# Patient Record
Sex: Female | Born: 1995 | Race: White | Hispanic: Yes | Marital: Single | Smoking: Former smoker
Health system: Southern US, Community
[De-identification: ages and names within clinical notes are randomized; demographics above are authoritative.]

## PROBLEM LIST (undated history)

## (undated) ENCOUNTER — Emergency Department: Discharge status: Absent without leave | Payer: No Typology Code available for payment source

## (undated) DIAGNOSIS — N938 Other specified abnormal uterine and vaginal bleeding: Secondary | ICD-10-CM

## (undated) DIAGNOSIS — R102 Pelvic and perineal pain unspecified side: Secondary | ICD-10-CM

## (undated) HISTORY — DX: Pelvic and perineal pain: R10.2

## (undated) HISTORY — PX: WISDOM TOOTH EXTRACTION: SHX21

## (undated) HISTORY — DX: Pelvic and perineal pain unspecified side: R10.20

## (undated) HISTORY — DX: Other specified abnormal uterine and vaginal bleeding: N93.8

## (undated) HISTORY — PX: NO PAST SURGERIES: SHX2092

---
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
## 2017-04-15 ENCOUNTER — Emergency Department
Admission: EM | Admit: 2017-04-15 | Discharge: 2017-04-15 | Disposition: A | Discharge status: Home / Self Care | Payer: Self-pay | Attending: Emergency Medicine | Admitting: Emergency Medicine

## 2017-04-15 ENCOUNTER — Encounter: Payer: Self-pay | Admitting: Emergency Medicine

## 2017-04-15 DIAGNOSIS — M791 Myalgia, unspecified site: Secondary | ICD-10-CM

## 2017-04-15 DIAGNOSIS — R5381 Other malaise: Secondary | ICD-10-CM | POA: Insufficient documentation

## 2017-04-15 DIAGNOSIS — N3000 Acute cystitis without hematuria: Secondary | ICD-10-CM | POA: Insufficient documentation

## 2017-04-15 LAB — COMPREHENSIVE METABOLIC PANEL
ALK PHOS: 81 U/L (ref 38–126)
ALT: 40 U/L (ref 14–54)
AST: 33 U/L (ref 15–41)
Albumin: 4.1 g/dL (ref 3.5–5.0)
Anion gap: 12 (ref 5–15)
BUN: 7 mg/dL (ref 6–20)
CHLORIDE: 108 mmol/L (ref 101–111)
CO2: 19 mmol/L — ABNORMAL LOW (ref 22–32)
Calcium: 9.1 mg/dL (ref 8.9–10.3)
Creatinine, Ser: 0.39 mg/dL — ABNORMAL LOW (ref 0.44–1.00)
GFR calc Af Amer: >60 mL/min (ref >60)
Glucose, Bld: 98 mg/dL (ref 65–99)
Potassium: 3.5 mmol/L (ref 3.5–5.1)
Sodium: 139 mmol/L (ref 135–145)
Total Bilirubin: 0.4 mg/dL (ref 0.3–1.2)
Total Protein: 8 g/dL (ref 6.5–8.1)

## 2017-04-15 LAB — URINALYSIS, COMPLETE (UACMP) WITH MICROSCOPIC
Bilirubin Urine: NEGATIVE
Glucose, UA: NEGATIVE mg/dL
Ketones, ur: NEGATIVE mg/dL
Nitrite: NEGATIVE
Protein, ur: 30 mg/dL — AB
SPECIFIC GRAVITY, URINE: 1.023 (ref 1.005–1.030)
pH: 6 (ref 5.0–8.0)

## 2017-04-15 LAB — CBC
HCT: 36.9 % (ref 35.0–47.0)
Hemoglobin: 12.6 g/dL (ref 12.0–16.0)
MCH: 29 pg (ref 26.0–34.0)
MCHC: 34.1 g/dL (ref 32.0–36.0)
MCV: 85 fL (ref 80.0–100.0)
Platelets: 297 10*3/uL (ref 150–440)
RBC: 4.35 MIL/uL (ref 3.80–5.20)
RDW: 12.8 % (ref 11.5–14.5)
WBC: 11 10*3/uL (ref 3.6–11.0)

## 2017-04-15 LAB — LIPASE, BLOOD: LIPASE: 15 U/L (ref 11–51)

## 2017-04-15 LAB — POCT PREGNANCY, URINE: PREG TEST UR: NEGATIVE

## 2017-04-15 MED ORDER — CEPHALEXIN 500 MG PO CAPS: 500.0000 mg | ORAL_CAPSULE | Freq: Four times a day (QID) | ORAL | 0 refills | Status: AC

## 2017-04-15 MED ORDER — SODIUM CHLORIDE 0.9 % IV BOLUS (SEPSIS)
1000.0000 mL | Freq: Once | INTRAVENOUS | Status: AC
  Administered 2017-04-15: 1000 mL via INTRAVENOUS

## 2017-04-15 MED ORDER — ONDANSETRON 4 MG PO TBDP
4.0000 mg | ORAL_TABLET | Freq: Once | ORAL | Status: AC | PRN
  Administered 2017-04-15: 4 mg via ORAL
  Filled 2017-04-15: qty 1

## 2017-04-15 MED ORDER — OXYCODONE-ACETAMINOPHEN 5-325 MG PO TABS
ORAL_TABLET | ORAL | Status: AC
  Filled 2017-04-15: qty 1

## 2017-04-15 MED ORDER — PHENAZOPYRIDINE HCL 100 MG PO TABS: 100.0000 mg | ORAL_TABLET | Freq: Three times a day (TID) | ORAL | 0 refills | Status: DC | PRN

## 2017-04-15 MED ORDER — OXYCODONE-ACETAMINOPHEN 5-325 MG PO TABS
1.0000 | ORAL_TABLET | ORAL | Status: DC | PRN
  Administered 2017-04-15: 1 via ORAL

## 2017-04-15 MED ORDER — DEXTROSE 5 % IV SOLN
1.0000 g | Freq: Once | INTRAVENOUS | Status: AC
  Administered 2017-04-15: 1 g via INTRAVENOUS
  Filled 2017-04-15: qty 10

## 2017-04-15 MED ORDER — KETOROLAC TROMETHAMINE 30 MG/ML IJ SOLN
30.0000 mg | Freq: Once | INTRAMUSCULAR | Status: AC
  Administered 2017-04-15: 30 mg via INTRAVENOUS
  Filled 2017-04-15: qty 1

## 2017-04-15 NOTE — ED Triage Notes (Signed)
Patient to ER for c/o lower abdominal pain and body aches for approx 1 week. States pain has not improved, now having N/V/D. Denies known fevers. Patient ambulatory to triage without difficulty. Patient states pain and vomiting seem to be worse after eating.

## 2017-04-15 NOTE — ED Provider Notes (Signed)
[facility] Emergency Department Provider Note   ____________________________________________   First MD Initiated Contact with Patient 04/15/17 [phone]     (approximate)  I have reviewed the triage vital signs and the nursing notes.   HISTORY  Chief Complaint Abdominal Pain    HPI Dawn Richards is a 21 y.o. female Who comes into the hospital today with a lot of lower abdominal pain. She reports that she's been having this pain for the past week. She went to sleep and then woke up and states the pain was worse. She had pain all over her body and she reports again in her lower abdomen. She is unsure what is causing the pain but it's in her mid lower abdomen. The patient also states that she's been having pain when she urinates. She has not been taking anything for pain at home. She states that she is also been vomiting when she eats and unable to keep anything down. The patient denies any fevers or pain with urination. She is denies blood in her urine but has had some back pain. The patient rates her pain a 10 out of 10 in intensity. She is here today for evaluation.   Past Medical History:  Diagnosis Date  . DUB (dysfunctional uterine bleeding)   . Pelvic pain     Patient Active Problem List   Diagnosis Date Noted  . 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 Dysmenorrhea 08/13/2016  . Abnormal uterine bleeding (AUB) 08/13/2016    Past Surgical History:  Procedure Laterality Date  . NO PAST SURGERIES      Prior to Admission medications   Medication Sig Start Date End Date Taking? Authorizing Provider  cephALEXin (KEFLEX) 500 MG capsule Take 1 capsule (500 mg total) by mouth 4 (four) times daily. 04/15/17 04/25/17  Rebecka Apley, MD  Norethindrone-Ethinyl Estradiol-Fe Biphas (LO LOESTRIN FE) 1 MG-10 MCG / 10 MCG tablet Take 1 tablet by mouth daily. 08/13/16   Defrancesco, Prentice Docker, MD  phenazopyridine (PYRIDIUM) 100 MG tablet Take 1 tablet (100 mg  total) by mouth 3 (three) times daily as needed for pain. 04/15/17   Rebecka Apley, MD  traMADol (ULTRAM) 50 MG tablet Take 1 tablet (50 mg total) by mouth every 6 (six) hours as needed. 08/10/16   Rebecka Apley, MD    Allergies Patient has no known allergies.  Family History  Problem Relation Age of Onset  . Breast cancer Neg Hx   . Ovarian cancer Neg Hx   . Colon cancer Neg Hx   . Diabetes Neg Hx   . Heart disease Neg Hx     Social History Social History  Substance Use Topics  . Smoking status: Never Smoker  . Smokeless tobacco: Never Used  . Alcohol use 0.0 oz/week     Comment: occas    Review of Systems  Constitutional: No fever/chills Eyes: No visual changes. ENT: No sore throat. Cardiovascular: Denies chest pain. Respiratory: Denies shortness of breath. Gastrointestinal:  abdominal pain.   nausea,  vomiting.  No diarrhea.  No constipation. Genitourinary:  dysuria. Musculoskeletal: myalgias and  back pain. Skin: Negative for rash. Neurological: Negative for headaches, focal weakness or numbness.   ____________________________________________   PHYSICAL EXAM:  VITAL SIGNS: ED Triage Vitals  Enc Vitals Group     BP 04/15/17 0401 (!) 116/101     Pulse Rate 04/15/17 0401 98     Resp 04/15/17 0401 20     Temp 04/15/17 0401 97.7  F (36.5 C)     Temp Source 04/15/17 0401 Oral     SpO2 04/15/17 0401 96 %     Weight --      Height 04/15/17 0401 5\' 1"  (1.549 m)     Head Circumference --      Peak Flow --      Pain Score 04/15/17 0400 10     Pain Loc --      Pain Edu? --      Excl. in GC? --     Constitutional: Alert and oriented. Well appearing and in no acute distress. Eyes: Conjunctivae are normal. PERRL. EOMI. Head: Atraumatic. Nose: No congestion/rhinnorhea. Mouth/Throat: Mucous membranes are moist.  Oropharynx non-erythematous. Cardiovascular: Normal rate, regular rhythm. Grossly normal heart sounds.  Good peripheral  circulation. Respiratory: Normal respiratory effort.  No retractions. Lungs CTAB. Gastrointestinal: Soft with suprapubic tenderness to palpation. No distention. Positive bowel sounds Musculoskeletal: No lower extremity tenderness nor edema.   Neurologic:  Normal speech and language. No gross focal neurologic deficits are appreciated. No gait instability. Skin:  Skin is warm, dry and intact. No rash noted. Psychiatric: Mood and affect are normal. Speech and behavior are normal.  ____________________________________________   LABS (all labs ordered are listed, but only abnormal results are displayed)  Labs Reviewed  COMPREHENSIVE METABOLIC PANEL - Abnormal; Notable for the following:       Result Value   CO2 19 (*)    Creatinine, Ser 0.39 (*)    All other components within normal limits  URINALYSIS, COMPLETE (UACMP) WITH MICROSCOPIC - Abnormal; Notable for the following:    Color, Urine YELLOW (*)    APPearance HAZY (*)    Hgb urine dipstick MODERATE (*)    Protein, ur 30 (*)    Leukocytes, UA SMALL (*)    Bacteria, UA RARE (*)    Squamous Epithelial / LPF 0-5 (*)    All other components within normal limits  URINE CULTURE  LIPASE, BLOOD  CBC  POC URINE PREG, ED   ____________________________________________  EKG  none ____________________________________________  RADIOLOGY  No results found.  ____________________________________________   PROCEDURES  Procedure(s) performed: None  Procedures  Critical Care performed: No  ____________________________________________   INITIAL IMPRESSION / ASSESSMENT AND PLAN / ED COURSE  Pertinent labs & imaging results that were available during my care of the patient were reviewed by me and considered in my medical decision making (see chart for details).  this is a 21 year old female who comes into the hospital today with some lower abdominal pain. The patient had some blood work drawn and it appears that she has some  urinary tract infection. the patient's white blood cell count is normal. She did receive a dose of Percocet as well as Zofran in triage. I will give the patient a dose of ceftriaxone and Toradol. His lungs the patient is able to drink and keep down fluids I will discharge her with some antibiotics for urinary tract infection. The patient did receive a liter of normal saline as well.the patient will be discharged home to follow-up with the acute care clinic.      ____________________________________________   FINAL CLINICAL IMPRESSION(S) / ED DIAGNOSES  Final diagnoses:  Malaise  Acute cystitis without hematuria  Myalgia      NEW MEDICATIONS STARTED DURING THIS VISIT:  New Prescriptions   CEPHALEXIN (KEFLEX) 500 MG CAPSULE    Take 1 capsule (500 mg total) by mouth 4 (four) times daily.   PHENAZOPYRIDINE (PYRIDIUM) 100  MG TABLET    Take 1 tablet (100 mg total) by mouth 3 (three) times daily as needed for pain.     Note:  This document was prepared using Dragon voice recognition software and may include unintentional dictation errors.    Rebecka ApleyWebster, Allison P, MD 04/15/17 [phone]

## 2017-04-15 NOTE — ED Notes (Signed)
Pt tolerating gingerale without difficulty 

## 2017-04-17 LAB — URINE CULTURE

## 2017-05-07 ENCOUNTER — Emergency Department
Admission: EM | Admit: 2017-05-07 | Discharge: 2017-05-08 | Disposition: A | Discharge status: Home / Self Care | Payer: Self-pay | Attending: Emergency Medicine | Admitting: Emergency Medicine

## 2017-05-07 ENCOUNTER — Encounter: Payer: Self-pay | Admitting: Emergency Medicine

## 2017-05-07 DIAGNOSIS — E872 Acidosis, unspecified: Secondary | ICD-10-CM

## 2017-05-07 DIAGNOSIS — Y929 Unspecified place or not applicable: Secondary | ICD-10-CM | POA: Insufficient documentation

## 2017-05-07 DIAGNOSIS — F4325 Adjustment disorder with mixed disturbance of emotions and conduct: Secondary | ICD-10-CM

## 2017-05-07 DIAGNOSIS — Z79899 Other long term (current) drug therapy: Secondary | ICD-10-CM | POA: Insufficient documentation

## 2017-05-07 DIAGNOSIS — Y998 Other external cause status: Secondary | ICD-10-CM | POA: Insufficient documentation

## 2017-05-07 DIAGNOSIS — T39312A Poisoning by propionic acid derivatives, intentional self-harm, initial encounter: Secondary | ICD-10-CM | POA: Insufficient documentation

## 2017-05-07 DIAGNOSIS — T1491XA Suicide attempt, initial encounter: Secondary | ICD-10-CM | POA: Insufficient documentation

## 2017-05-07 DIAGNOSIS — Y9389 Activity, other specified: Secondary | ICD-10-CM | POA: Insufficient documentation

## 2017-05-07 DIAGNOSIS — X838XXA Intentional self-harm by other specified means, initial encounter: Secondary | ICD-10-CM | POA: Insufficient documentation

## 2017-05-07 DIAGNOSIS — T39392S Poisoning by other nonsteroidal anti-inflammatory drugs [NSAID], intentional self-harm, sequela: Secondary | ICD-10-CM

## 2017-05-07 LAB — COMPREHENSIVE METABOLIC PANEL
ALT: 35 U/L (ref 14–54)
ANION GAP: 9 (ref 5–15)
AST: 25 U/L (ref 15–41)
Albumin: 4.2 g/dL (ref 3.5–5.0)
Alkaline Phosphatase: 74 U/L (ref 38–126)
BUN: 12 mg/dL (ref 6–20)
CALCIUM: 8.8 mg/dL — AB (ref 8.9–10.3)
CO2: 16 mmol/L — AB (ref 22–32)
Chloride: 112 mmol/L — ABNORMAL HIGH (ref 101–111)
Creatinine, Ser: 1 mg/dL (ref 0.44–1.00)
GFR calc non Af Amer: >60 mL/min (ref >60)
Glucose, Bld: 99 mg/dL (ref 65–99)
Potassium: 3.9 mmol/L (ref 3.5–5.1)
SODIUM: 137 mmol/L (ref 135–145)
Total Bilirubin: 0.5 mg/dL (ref 0.3–1.2)
Total Protein: 8.2 g/dL — ABNORMAL HIGH (ref 6.5–8.1)

## 2017-05-07 LAB — CBC
HCT: 40.2 % (ref 35.0–47.0)
HEMOGLOBIN: 13.6 g/dL (ref 12.0–16.0)
MCH: 29 pg (ref 26.0–34.0)
MCHC: 33.9 g/dL (ref 32.0–36.0)
MCV: 85.4 fL (ref 80.0–100.0)
PLATELETS: 283 10*3/uL (ref 150–440)
RBC: 4.7 MIL/uL (ref 3.80–5.20)
RDW: 13.2 % (ref 11.5–14.5)
WBC: 17 10*3/uL — ABNORMAL HIGH (ref 3.6–11.0)

## 2017-05-07 LAB — ACETAMINOPHEN LEVEL

## 2017-05-07 LAB — SALICYLATE LEVEL

## 2017-05-07 LAB — POCT PREGNANCY, URINE: PREG TEST UR: NEGATIVE

## 2017-05-07 LAB — ETHANOL: Alcohol, Ethyl (B): <10 mg/dL (ref <10)

## 2017-05-07 MED ORDER — SODIUM CHLORIDE 0.9 % IV BOLUS (SEPSIS)
1000.0000 mL | Freq: Once | INTRAVENOUS | Status: AC
  Administered 2017-05-08: 1000 mL via INTRAVENOUS

## 2017-05-07 NOTE — ED Notes (Addendum)
White tank, white bra, black skirt, flipflops, removed and placed into labeled pt belonging bag to be secured on nursing unit; gold tone necklace and earrings with $5 bill given to pt's sister at her request Dois Davenport)
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
## 2017-05-07 NOTE — ED Provider Notes (Signed)
[facility] Emergency Department Provider Note  ____________________________________________  Time seen: Approximately 11:56 PM  I have reviewed the triage vital signs and the nursing notes.   HISTORY  Chief Complaint No chief complaint on file.   HPI Dawn Richards is a 21 y.o. female with no significant past medical history who presents for evaluation after a intentional overdose. Patient reports a 24 hours ago she took 20 x 800 mg ibuprofen in a suicide attempt. Today she told a friend who brought her to the emergency room. She is complaining of nausea that has been constant for the last several hours but no vomiting, no chest pain, no abdominal pain, no hematemesis or melena. Patient denies prior history of depression but does state that she's been going through a lot recently. she has been feeling very depressed. She denies drugs and alcohol. She denies any other medical problems.   Past Medical History:  Diagnosis Date  . DUB (dysfunctional uterine bleeding)   . Pelvic pain     Patient Active Problem List   Diagnosis Date Noted  . 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 Dysmenorrhea 08/13/2016  . Abnormal uterine bleeding (AUB) 08/13/2016    Past Surgical History:  Procedure Laterality Date  . NO PAST SURGERIES      Prior to Admission medications   Medication Sig Start Date End Date Taking? Authorizing Provider  Norethindrone-Ethinyl Estradiol-Fe Biphas (LO LOESTRIN FE) 1 MG-10 MCG / 10 MCG tablet Take 1 tablet by mouth daily. 08/13/16   Defrancesco, Prentice Docker, MD    Allergies Patient has no known allergies.  Family History  Problem Relation Age of Onset  . Breast cancer Neg Hx   . Ovarian cancer Neg Hx   . Colon cancer Neg Hx   . Diabetes Neg Hx   . Heart disease Neg Hx     Social History Social History  Substance Use Topics  . Smoking status: Never Smoker  . Smokeless tobacco: Never Used  . Alcohol use 0.0 oz/week     Comment:  occas    Review of Systems  Constitutional: Negative for fever. Eyes: Negative for visual changes. ENT: Negative for sore throat. Neck: No neck pain  Cardiovascular: Negative for chest pain. Respiratory: Negative for shortness of breath. Gastrointestinal: Negative for abdominal pain, vomiting or diarrhea. + nausea Genitourinary: Negative for dysuria. Musculoskeletal: Negative for back pain. Skin: Negative for rash. Neurological: Negative for headaches, weakness or numbness. Psych: + SI. No HI  ____________________________________________   PHYSICAL EXAM:  VITAL SIGNS: ED Triage Vitals  Enc Vitals Group     BP 05/07/17 2255 124/79     Pulse --      Resp 05/07/17 2255 18     Temp 05/07/17 2255 97.9 F (36.6 C)     Temp Source 05/07/17 2255 Oral     SpO2 05/07/17 2255 98 %     Weight 05/07/17 2252 200 lb (90.7 kg)     Height 05/07/17 2252 5' (1.524 m)     Head Circumference --      Peak Flow --      Pain Score --      Pain Loc --      Pain Edu? --      Excl. in GC? --     Constitutional: Alert and oriented. Well appearing and in no apparent distress. HEENT:      Head: Normocephalic and atraumatic.         Eyes: Conjunctivae are normal. Sclera  is non-icteric.       Mouth/Throat: Mucous membranes are moist.       Neck: Supple with no signs of meningismus. Cardiovascular: Regular rate and rhythm. No murmurs, gallops, or rubs. 2+ symmetrical distal pulses are present in all extremities. No JVD. Respiratory: Normal respiratory effort. Lungs are clear to auscultation bilaterally. No wheezes, crackles, or rhonchi.  Gastrointestinal: Soft, non tender, and non distended with positive bowel sounds. No rebound or guarding. Musculoskeletal: Nontender with normal range of motion in all extremities. No edema, cyanosis, or erythema of extremities. Neurologic: Normal speech and language. Face is symmetric. Moving all extremities. No gross focal neurologic deficits are  appreciated. Skin: Skin is warm, dry and intact. No rash noted. Psychiatric: Mood and affect are normal. Speech and behavior are normal.  ____________________________________________   LABS (all labs ordered are listed, but only abnormal results are displayed)  Labs Reviewed  COMPREHENSIVE METABOLIC PANEL - Abnormal; Notable for the following:       Result Value   Chloride 112 (*)    CO2 16 (*)    Calcium 8.8 (*)    Total Protein 8.2 (*)    All other components within normal limits  ACETAMINOPHEN LEVEL - Abnormal; Notable for the following:    Acetaminophen (Tylenol), Serum <10 (*)    All other components within normal limits  CBC - Abnormal; Notable for the following:    WBC 17.0 (*)    All other components within normal limits  URINE DRUG SCREEN, QUALITATIVE ([facility] ONLY) - Abnormal; Notable for the following:    Cannabinoid 50 Ng, Ur [place] POSITIVE (*)    All other components within normal limits  URINALYSIS, COMPLETE (UACMP) WITH MICROSCOPIC - Abnormal; Notable for the following:    Color, Urine YELLOW (*)    APPearance CLOUDY (*)    Glucose, UA 50 (*)    Hgb urine dipstick MODERATE (*)    Protein, ur 100 (*)    Leukocytes, UA LARGE (*)    Bacteria, UA RARE (*)    Squamous Epithelial / LPF 0-5 (*)    Non Squamous Epithelial 0-5 (*)    All other components within normal limits  BASIC METABOLIC PANEL - Abnormal; Notable for the following:    Chloride 115 (*)    CO2 17 (*)    Glucose, Bld 135 (*)    Calcium 8.2 (*)    All other components within normal limits  ETHANOL  SALICYLATE LEVEL  POCT PREGNANCY, URINE   ____________________________________________  EKG  ED ECG REPORT I, Nita Sickle, the attending physician, personally viewed and interpreted this ECG.  Normal sinus rhythm, rate of 76, normal intervals, normal axis, no ST elevations or depressions.  ____________________________________________  RADIOLOGY  none   ____________________________________________   PROCEDURES  Procedure(s) performed: None Procedures Critical Care performed:  None ____________________________________________   INITIAL IMPRESSION / ASSESSMENT AND PLAN / ED COURSE  21 y.o. female with no significant past medical history who presents for evaluation after a intentional overdose after taking 20 x 800 mg of ibuprofen 24 hour PTA. patient is hemodynamically stable with nausea. Discussed with poison control who recommended IVF due to nausea and low CO2. Electrolytes, salicylate and acetaminophen levels are all within normal limits otherwise. Patient will be given Zofran for nausea. EKG is pending.IVC papers will be taken and psychiatry has been consulted.  Clinical Course as of May 09 743  Fri May 08, 2017  1610 Repeat labs after IVF showing persistent low CO2 with no  AG. Will give a dose of bicarbonate and 2nd bolus and repeat BMP. Patient will be medically cleared once bicarbonate level is normalized.  [CV]  0451 Psychiatry recommended inpatient admission after medically cleared  [CV]  0720 Signout from Dr. Don Perking: Pt is s/p ibuprofen overdose.  She has persistent low bicarb after fluids; will be given repletion gtt x 3h and then will recheck.  If bicarb normal and pt medically cleared, will be admitted to psych.   [SS]  0742 Bicarbonate is slightly improved currently at 20. We'll start patient on a bicarbonate drip for 3 hours and repeat BMP. Care transferred to Dr. Marisa Severin  [CV]    Clinical Course User Index [CV] Nita Sickle, MD [SS] Dionne Bucy, MD    Pertinent labs & imaging results that were available during my care of the patient were reviewed by me and considered in my medical decision making (see chart for details).    ____________________________________________   FINAL CLINICAL IMPRESSION(S) / ED DIAGNOSES  Final diagnoses:  Intentional ibuprofen overdose, initial encounter (HCC)   Suicide attempt (HCC)  Metabolic acidosis      NEW MEDICATIONS STARTED DURING THIS VISIT:  New Prescriptions   No medications on file     Note:  This document was prepared using Dragon voice recognition software and may include unintentional dictation errors.    Don Perking, [place], MD 05/08/17 [phone]

## 2017-05-08 ENCOUNTER — Other Ambulatory Visit: Payer: Self-pay

## 2017-05-08 DIAGNOSIS — T39392S Poisoning by other nonsteroidal anti-inflammatory drugs [NSAID], intentional self-harm, sequela: Secondary | ICD-10-CM

## 2017-05-08 DIAGNOSIS — F4325 Adjustment disorder with mixed disturbance of emotions and conduct: Secondary | ICD-10-CM

## 2017-05-08 LAB — URINE DRUG SCREEN, QUALITATIVE (ARMC ONLY)
Amphetamines, Ur Screen: NOT DETECTED
BARBITURATES, UR SCREEN: NOT DETECTED
Benzodiazepine, Ur Scrn: NOT DETECTED
CANNABINOID 50 NG, UR ~~LOC~~: POSITIVE — AB
COCAINE METABOLITE, UR ~~LOC~~: NOT DETECTED
MDMA (Ecstasy)Ur Screen: NOT DETECTED
Methadone Scn, Ur: NOT DETECTED
OPIATE, UR SCREEN: NOT DETECTED
PHENCYCLIDINE (PCP) UR S: NOT DETECTED
Tricyclic, Ur Screen: NOT DETECTED

## 2017-05-08 LAB — URINALYSIS, COMPLETE (UACMP) WITH MICROSCOPIC
Bilirubin Urine: NEGATIVE
GLUCOSE, UA: 50 mg/dL — AB
KETONES UR: NEGATIVE mg/dL
Nitrite: NEGATIVE
PROTEIN: 100 mg/dL — AB
Specific Gravity, Urine: 1.016 (ref 1.005–1.030)
pH: 5 (ref 5.0–8.0)

## 2017-05-08 LAB — BASIC METABOLIC PANEL
ANION GAP: 8 (ref 5–15)
Anion gap: 5 (ref 5–15)
Anion gap: 7 (ref 5–15)
Anion gap: 9 (ref 5–15)
BUN: 12 mg/dL (ref 6–20)
BUN: 9 mg/dL (ref 6–20)
BUN: 8 mg/dL (ref 6–20)
BUN: 6 mg/dL (ref 6–20)
CALCIUM: 8.2 mg/dL — AB (ref 8.9–10.3)
CALCIUM: 7.9 mg/dL — AB (ref 8.9–10.3)
CO2: 17 mmol/L — ABNORMAL LOW (ref 22–32)
CO2: 20 mmol/L — ABNORMAL LOW (ref 22–32)
CO2: 20 mmol/L — ABNORMAL LOW (ref 22–32)
CO2: 20 mmol/L — ABNORMAL LOW (ref 22–32)
CREATININE: 0.78 mg/dL (ref 0.44–1.00)
CREATININE: 0.65 mg/dL (ref 0.44–1.00)
Calcium: 8.1 mg/dL — ABNORMAL LOW (ref 8.9–10.3)
Calcium: 8.2 mg/dL — ABNORMAL LOW (ref 8.9–10.3)
Chloride: 115 mmol/L — ABNORMAL HIGH (ref 101–111)
Chloride: 115 mmol/L — ABNORMAL HIGH (ref 101–111)
Chloride: 112 mmol/L — ABNORMAL HIGH (ref 101–111)
Chloride: 111 mmol/L (ref 101–111)
Creatinine, Ser: 0.55 mg/dL (ref 0.44–1.00)
Creatinine, Ser: 0.61 mg/dL (ref 0.44–1.00)
GFR calc Af Amer: >60 mL/min (ref >60)
GFR calc Af Amer: >60 mL/min (ref >60)
GFR calc Af Amer: >60 mL/min (ref >60)
GFR calc Af Amer: >60 mL/min (ref >60)
GLUCOSE: 135 mg/dL — AB (ref 65–99)
GLUCOSE: 133 mg/dL — AB (ref 65–99)
GLUCOSE: 114 mg/dL — AB (ref 65–99)
GLUCOSE: 170 mg/dL — AB (ref 65–99)
POTASSIUM: 3.8 mmol/L (ref 3.5–5.1)
POTASSIUM: 3.3 mmol/L — AB (ref 3.5–5.1)
Potassium: 3.8 mmol/L (ref 3.5–5.1)
Potassium: 3.6 mmol/L (ref 3.5–5.1)
Sodium: 140 mmol/L (ref 135–145)
Sodium: 140 mmol/L (ref 135–145)
Sodium: 139 mmol/L (ref 135–145)
Sodium: 140 mmol/L (ref 135–145)

## 2017-05-08 LAB — BLOOD GAS, VENOUS
ACID-BASE DEFICIT: 3.6 mmol/L — AB (ref 0.0–2.0)
BICARBONATE: 20.4 mmol/L (ref 20.0–28.0)
O2 SAT: 96.1 %
Patient temperature: 37
pCO2, Ven: 33 mmHg — ABNORMAL LOW (ref 44.0–60.0)
pH, Ven: 7.4 (ref 7.250–7.430)
pO2, Ven: 83 mmHg — ABNORMAL HIGH (ref 32.0–45.0)

## 2017-05-08 MED ORDER — SODIUM BICARBONATE 8.4 % IV SOLN
INTRAVENOUS | Status: AC
  Filled 2017-05-08: qty 50

## 2017-05-08 MED ORDER — DEXTROSE 5 % IV SOLN
INTRAVENOUS | Status: DC
  Administered 2017-05-08: 08:00:00 via INTRAVENOUS
  Filled 2017-05-08 (×3): qty 150

## 2017-05-08 MED ORDER — FOSFOMYCIN TROMETHAMINE 3 G PO PACK
3.0000 g | PACK | Freq: Once | ORAL | Status: AC
  Administered 2017-05-08: 3 g via ORAL
  Filled 2017-05-08: qty 3

## 2017-05-08 MED ORDER — SODIUM CHLORIDE 0.9 % IV BOLUS (SEPSIS)
1000.0000 mL | Freq: Once | INTRAVENOUS | Status: AC
  Administered 2017-05-08: 1000 mL via INTRAVENOUS

## 2017-05-08 MED ORDER — SODIUM BICARBONATE 8.4 % IV SOLN
50.0000 meq | Freq: Once | INTRAVENOUS | Status: AC
  Administered 2017-05-08: 50 meq via INTRAVENOUS
  Filled 2017-05-08: qty 50

## 2017-05-08 MED ORDER — ONDANSETRON HCL 4 MG/2ML IJ SOLN
4.0000 mg | Freq: Once | INTRAMUSCULAR | Status: AC
  Administered 2017-05-08: 4 mg via INTRAVENOUS
  Filled 2017-05-08: qty 2

## 2017-05-08 NOTE — ED Notes (Signed)
BMP sent down to lab.

## 2017-05-08 NOTE — ED Notes (Signed)
Labs redrawn

## 2017-05-08 NOTE — Discharge Instructions (Signed)
Return to the ER for new, worsening or persistent vomiting, weakness or any other symptoms that concern you, as well as if you have any new or recurrent feelings of wanting to hurt yourself.  Follow up at [facility] and with your primary doctor.

## 2017-05-08 NOTE — ED Notes (Signed)
Pt given meal tray.

## 2017-05-08 NOTE — ED Notes (Signed)
MD notified about lab results of repeat BMP. Will check another bmp at 1300 with [facility] per Dr. Marisa Severin.

## 2017-05-08 NOTE — ED Notes (Signed)
Called pharmacy for D5 w/ bicarb

## 2017-05-08 NOTE — Consult Note (Signed)
[facility] Consult   Reason for Consult:  Consult for 21 year old woman who came to the emergency room after having symptoms from an overdose of ibuprofen Referring Physician:  Cinda Quest Patient Identification: Dawn Richards MRN:  [id number] Principal Diagnosis: Adjustment disorder with mixed disturbance of emotions and conduct Diagnosis:   Patient Active Problem List   Diagnosis Date Noted  . Adjustment disorder with mixed disturbance of emotions and conduct [F43.25] 05/08/2017  . NSAID overdose, intentional self-harm, sequela ([place]) [T39.392S] 05/08/2017  . Female dyspareunia [N94.10] 08/13/2016  . Dysmenorrhea [N94.6] 08/13/2016  . Abnormal uterine bleeding (AUB) [N93.9] 08/13/2016    Total Time spent with patient: 1 hour  Subjective:   Dawn Richards is a 21 y.o. female patient admitted with "I took a bunch of pills".  HPI:  Patient seen and interviewed chart reviewed. 21 year old woman came to the emergency room last night complaining of abdominal pain and having some blood in her vomit. Patient reports that on Wednesday night she had taken about 20 ibuprofen tablets. She did this very spontaneously while she was feeling bad. The most acute stress was that her father had shown anger with her and disappointment when he found that she had used marijuana. Patient says at that time she was not thinking clearly and did not really want to die. Very soon after doing it she called a friend on the phone and told them about it. The friend unfortunately gave her less than solid advice and told her to just sleep it off. The next day she was having a stomachache the progress through the day and she finally came to the hospital. Patient reports she has had significant stress recently. She had been living with a friend but had to move out of their this last weekend and now is back living with her parents. She had gone to a party also this weekend and came home smelling like  marijuana which upset her parents. She denies however being consistently sad and down. Doesn't feel hopeless. Does not have any wish to die currently. Denies any psychotic symptoms. Sleeps and eats adequately. She says she drinks alcohol only rarely with friends and doesn't feel it's been a problem. As she only used marijuana 1 time this weekend. Not currently receiving outpatient mental health treatment.  Social history: Patient is living with her parents. 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  Medical history: She had some typical lab values for the overdose of ibuprofen and had some nausea with some gastric bleeding but doesn't have any ongoing medical issues.  Substance abuse history: As noted above she claims that she only uses marijuana one time. She has it in her drug screen but nothing else. Says she drinks only rarely.  Past Psychiatric History: Patient was seen by a therapist when she was about 21 years old for some cutting that she was doing at that time. He says the therapy was helpful for her. Had no other self injury between that time and now. No history of hospitalizations. Never been prescribed any psychiatric medicine other than having been put on ADHD medicine as a smaller child. She took it only briefly because reportedly her mother did not like the effect. No history of violence. No psychosis.  Risk to Self: Suicidal Ideation: Yes-Currently Present Suicidal Intent: Yes-Currently Present Is patient at risk for suicide?: Yes Suicidal Plan?: Yes-Currently Present Specify Current Suicidal  Plan: Pt took an overdose of pills Access to Means: Yes Specify Access to Suicidal Means: pt has access to pills What has been your use of drugs/alcohol within the last 12 months?: alcohol How many times?: 1 Other Self Harm Risks: none identified Triggers for Past Attempts:  Anniversary Intentional Self Injurious Behavior: Cutting Comment - Self Injurious Behavior: Pt reports hx of cutting Risk to Others: Homicidal Ideation: No Thoughts of Harm to Others: No Current Homicidal Intent: No Current Homicidal Plan: No Access to Homicidal Means: No Identified Victim: none identified History of harm to others?: No Assessment of Violence: None Noted Violent Behavior Description: none identified Does patient have access to weapons?: No Criminal Charges Pending?: No Does patient have a court date: No Prior Inpatient Therapy: Prior Inpatient Therapy: No Prior Therapy Dates: na Prior Therapy Facilty/Provider(s): na Reason for Treatment: na Prior Outpatient Therapy: Prior Outpatient Therapy: No Prior Therapy Dates: na Prior Therapy Facilty/Provider(s): na Reason for Treatment: na Does patient have an ACCT team?: No Does patient have Intensive In-House Services?  : No Does patient have [facility] services? : No Does patient have P4[facility] services?: No  Past Medical History:  Past Medical History:  Diagnosis Date  . DUB (dysfunctional uterine bleeding)   . Pelvic pain     Past Surgical History:  Procedure Laterality Date  . NO PAST SURGERIES     Family History:  Family History  Problem Relation Age of Onset  . Breast cancer Neg Hx   . Ovarian cancer Neg Hx   . Colon cancer Neg Hx   . Diabetes Neg Hx   . Heart disease Neg Hx    Family Psychiatric  History: Her mother has had some significant depression and mental health problems nobody else in the family known. Social History:  History  Alcohol Use  . 0.0 oz/week    Comment: occas     History  Drug Use No    Social History   Social History  . Marital status: Single    Spouse name: N/A  . Number of children: N/A  . Years of education: N/A   Social History Main Topics  . Smoking status: Never Smoker  . Smokeless tobacco: Never Used  . Alcohol use 0.0 oz/week     Comment: occas  . Drug use: No   . Sexual activity: Yes    Birth control/ protection: None   Other Topics Concern  . None   Social History Narrative  . None   Additional Social History:    Allergies:  No Known Allergies  Labs:  Results for orders placed or performed during the hospital encounter of 05/07/17 (from the past 48 hour(s))  Comprehensive metabolic panel     Status: Abnormal   Collection Time: 05/07/17 10:59 PM  Result Value Ref Range   Sodium 137 135 - 145 mmol/L   Potassium 3.9 3.5 - 5.1 mmol/L   Chloride 112 (H) 101 - 111 mmol/L   CO2 16 (L) 22 - 32 mmol/L   Glucose, Bld 99 65 - 99 mg/dL   BUN 12 6 - 20 mg/dL   Creatinine, Ser 1.00 0.44 - 1.00 mg/dL   Calcium 8.8 (L) 8.9 - 10.3 mg/dL   Total Protein 8.2 (H) 6.5 - 8.1 g/dL   Albumin 4.2 3.5 - 5.0 g/dL   AST 25 15 - 41 U/L   ALT 35 14 - 54 U/L   Alkaline Phosphatase 74 38 - 126 U/L   Total Bilirubin 0.5 0.3 -  1.2 mg/dL   GFR calc non Af Amer >60 >60 mL/min   GFR calc Af Amer >60 >60 mL/min    Comment: (NOTE) The eGFR has been calculated using the CKD EPI equation. This calculation has not been validated in all clinical situations. eGFR's persistently <60 mL/min signify possible Chronic Kidney Disease.    Anion gap 9 5 - 15  Ethanol     Status: None   Collection Time: 05/07/17 10:59 PM  Result Value Ref Range   Alcohol, Ethyl (B) <10 <10 mg/dL    Comment:        LOWEST DETECTABLE LIMIT FOR SERUM ALCOHOL IS 10 mg/dL FOR MEDICAL PURPOSES ONLY Please note change in reference range.   Salicylate level     Status: None   Collection Time: 05/07/17 10:59 PM  Result Value Ref Range   Salicylate Lvl <1.7 2.8 - 30.0 mg/dL  Acetaminophen level     Status: Abnormal   Collection Time: 05/07/17 10:59 PM  Result Value Ref Range   Acetaminophen (Tylenol), Serum <10 (L) 10 - 30 ug/mL    Comment:        THERAPEUTIC CONCENTRATIONS VARY SIGNIFICANTLY. A RANGE OF 10-30 ug/mL MAY BE AN EFFECTIVE CONCENTRATION FOR MANY PATIENTS. HOWEVER, SOME  ARE BEST TREATED AT CONCENTRATIONS OUTSIDE THIS RANGE. ACETAMINOPHEN CONCENTRATIONS >150 ug/mL AT 4 HOURS AFTER INGESTION AND >50 ug/mL AT 12 HOURS AFTER INGESTION ARE OFTEN ASSOCIATED WITH TOXIC REACTIONS.   cbc     Status: Abnormal   Collection Time: 05/07/17 10:59 PM  Result Value Ref Range   WBC 17.0 (H) 3.6 - 11.0 K/uL   RBC 4.70 3.80 - 5.20 MIL/uL   Hemoglobin 13.6 12.0 - 16.0 g/dL   HCT 40.2 35.0 - 47.0 %   MCV 85.4 80.0 - 100.0 fL   MCH 29.0 26.0 - 34.0 pg   MCHC 33.9 32.0 - 36.0 g/dL   RDW 13.2 11.5 - 14.5 %   Platelets 283 150 - 440 K/uL  Pregnancy, urine POC     Status: None   Collection Time: 05/07/17 11:19 PM  Result Value Ref Range   Preg Test, Ur NEGATIVE NEGATIVE    Comment:        THE SENSITIVITY OF THIS METHODOLOGY IS >24 mIU/mL   Urine Drug Screen, Qualitative     Status: Abnormal   Collection Time: 05/08/17 12:18 AM  Result Value Ref Range   Tricyclic, Ur Screen NONE DETECTED NONE DETECTED   Amphetamines, Ur Screen NONE DETECTED NONE DETECTED   MDMA (Ecstasy)Ur Screen NONE DETECTED NONE DETECTED   Cocaine Metabolite,Ur [place] NONE DETECTED NONE DETECTED   Opiate, Ur Screen NONE DETECTED NONE DETECTED   Phencyclidine (PCP) Ur S NONE DETECTED NONE DETECTED   Cannabinoid 50 Ng, Ur [place] POSITIVE (A) NONE DETECTED   Barbiturates, Ur Screen NONE DETECTED NONE DETECTED   Benzodiazepine, Ur Scrn NONE DETECTED NONE DETECTED   Methadone Scn, Ur NONE DETECTED NONE DETECTED    Comment: (NOTE) 616  Tricyclics, urine               Cutoff 1000 ng/mL 200  Amphetamines, urine             Cutoff 1000 ng/mL 300  MDMA (Ecstasy), urine           Cutoff 500 ng/mL 400  Cocaine Metabolite, urine       Cutoff 300 ng/mL 500  Opiate, urine  Cutoff 300 ng/mL 600  Phencyclidine (PCP), urine      Cutoff 25 ng/mL 700  Cannabinoid, urine              Cutoff 50 ng/mL 800  Barbiturates, urine             Cutoff 200 ng/mL 900  Benzodiazepine, urine           Cutoff 200  ng/mL 1000 Methadone, urine                Cutoff 300 ng/mL 1100 1200 The urine drug screen provides only a preliminary, unconfirmed 1300 analytical test result and should not be used for non-medical 1400 purposes. Clinical consideration and professional judgment should 1500 be applied to any positive drug screen result due to possible 1600 interfering substances. A more specific alternate chemical method 1700 must be used in order to obtain a confirmed analytical result.  1800 Gas chromato graphy / mass spectrometry (GC/MS) is the preferred 1900 confirmatory method.   Urinalysis, Complete w Microscopic     Status: Abnormal   Collection Time: 05/08/17 12:18 AM  Result Value Ref Range   Color, Urine YELLOW (A) YELLOW   APPearance CLOUDY (A) CLEAR   Specific Gravity, Urine 1.016 1.005 - 1.030   pH 5.0 5.0 - 8.0   Glucose, UA 50 (A) NEGATIVE mg/dL   Hgb urine dipstick MODERATE (A) NEGATIVE   Bilirubin Urine NEGATIVE NEGATIVE   Ketones, ur NEGATIVE NEGATIVE mg/dL   Protein, ur 100 (A) NEGATIVE mg/dL   Nitrite NEGATIVE NEGATIVE   Leukocytes, UA LARGE (A) NEGATIVE   RBC / HPF 6-30 0 - 5 RBC/hpf   WBC, UA TOO NUMEROUS TO COUNT 0 - 5 WBC/hpf   Bacteria, UA RARE (A) NONE SEEN   Squamous Epithelial / LPF 0-5 (A) NONE SEEN   WBC Clumps PRESENT    Mucus PRESENT    Non Squamous Epithelial 0-5 (A) NONE SEEN  Basic metabolic panel     Status: Abnormal   Collection Time: 05/08/17  3:00 AM  Result Value Ref Range   Sodium 140 135 - 145 mmol/L   Potassium 3.8 3.5 - 5.1 mmol/L   Chloride 115 (H) 101 - 111 mmol/L   CO2 17 (L) 22 - 32 mmol/L   Glucose, Bld 135 (H) 65 - 99 mg/dL   BUN 12 6 - 20 mg/dL   Creatinine, Ser 0.78 0.44 - 1.00 mg/dL   Calcium 8.2 (L) 8.9 - 10.3 mg/dL   GFR calc non Af Amer >60 >60 mL/min   GFR calc Af Amer >60 >60 mL/min    Comment: (NOTE) The eGFR has been calculated using the CKD EPI equation. This calculation has not been validated in all clinical  situations. eGFR's persistently <60 mL/min signify possible Chronic Kidney Disease.    Anion gap 8 5 - 15  Basic metabolic panel     Status: Abnormal   Collection Time: 05/08/17  6:25 AM  Result Value Ref Range   Sodium 140 135 - 145 mmol/L   Potassium 3.6 3.5 - 5.1 mmol/L   Chloride 115 (H) 101 - 111 mmol/L   CO2 20 (L) 22 - 32 mmol/L   Glucose, Bld 133 (H) 65 - 99 mg/dL   BUN 9 6 - 20 mg/dL   Creatinine, Ser 0.65 0.44 - 1.00 mg/dL   Calcium 7.9 (L) 8.9 - 10.3 mg/dL   GFR calc non Af Amer >60 >60 mL/min   GFR calc Af Amer >60 >  60 mL/min    Comment: (NOTE) The eGFR has been calculated using the CKD EPI equation. This calculation has not been validated in all clinical situations. eGFR's persistently <60 mL/min signify possible Chronic Kidney Disease.    Anion gap 5 5 - 15  Basic metabolic panel     Status: Abnormal   Collection Time: 05/08/17 10:43 AM  Result Value Ref Range   Sodium 139 135 - 145 mmol/L   Potassium 3.8 3.5 - 5.1 mmol/L   Chloride 112 (H) 101 - 111 mmol/L   CO2 20 (L) 22 - 32 mmol/L   Glucose, Bld 114 (H) 65 - 99 mg/dL   BUN 8 6 - 20 mg/dL   Creatinine, Ser 0.55 0.44 - 1.00 mg/dL   Calcium 8.1 (L) 8.9 - 10.3 mg/dL   GFR calc non Af Amer >60 >60 mL/min   GFR calc Af Amer >60 >60 mL/min    Comment: (NOTE) The eGFR has been calculated using the CKD EPI equation. This calculation has not been validated in all clinical situations. eGFR's persistently <60 mL/min signify possible Chronic Kidney Disease.    Anion gap 7 5 - 15  Basic metabolic panel     Status: Abnormal   Collection Time: 05/08/17  1:13 PM  Result Value Ref Range   Sodium 140 135 - 145 mmol/L   Potassium 3.3 (L) 3.5 - 5.1 mmol/L   Chloride 111 101 - 111 mmol/L   CO2 20 (L) 22 - 32 mmol/L   Glucose, Bld 170 (H) 65 - 99 mg/dL   BUN 6 6 - 20 mg/dL   Creatinine, Ser 0.61 0.44 - 1.00 mg/dL   Calcium 8.2 (L) 8.9 - 10.3 mg/dL   GFR calc non Af Amer >60 >60 mL/min   GFR calc Af Amer >60 >60  mL/min    Comment: (NOTE) The eGFR has been calculated using the CKD EPI equation. This calculation has not been validated in all clinical situations. eGFR's persistently <60 mL/min signify possible Chronic Kidney Disease.    Anion gap 9 5 - 15  Blood gas, venous     Status: Abnormal   Collection Time: 05/08/17  1:16 PM  Result Value Ref Range   pH, Ven 7.40 7.250 - 7.430   pCO2, Ven 33 (L) 44.0 - 60.0 mmHg   pO2, Ven 83.0 (H) 32.0 - 45.0 mmHg   Bicarbonate 20.4 20.0 - 28.0 mmol/L   Acid-base deficit 3.6 (H) 0.0 - 2.0 mmol/L   O2 Saturation 96.1 %   Patient temperature 37.0    Collection site VEIN    Sample type VEIN     Current Facility-Administered Medications  Medication Dose Route Frequency Provider Last Rate Last Dose  . sodium bicarbonate 150 mEq in dextrose 5 % 1,000 mL infusion   Intravenous Continuous Rudene Re, MD 125 mL/hr at 05/08/17 3810     Current Outpatient Prescriptions  Medication Sig Dispense Refill  . Norethindrone-Ethinyl Estradiol-Fe Biphas (LO LOESTRIN FE) 1 MG-10 MCG / 10 MCG tablet Take 1 tablet by mouth daily. 1 Package 11    Musculoskeletal: Strength & Muscle Tone: within normal limits Gait & Station: normal Patient leans: N/A  Psychiatric Specialty Exam: Physical Exam  Nursing note and vitals reviewed. Constitutional: She appears well-developed and well-nourished.  HENT:  Head: Normocephalic and atraumatic.  Eyes: Pupils are equal, round, and reactive to light. Conjunctivae are normal.  Neck: Normal range of motion.  Cardiovascular: Regular rhythm and normal heart sounds.   Respiratory: Effort normal.  No respiratory distress.  GI: Soft.  Musculoskeletal: Normal range of motion.  Neurological: She is alert.  Skin: Skin is warm and dry.  Psychiatric: Her speech is normal and behavior is normal. Judgment normal. Her mood appears anxious. Thought content is not paranoid. Cognition and memory are normal. She expresses no homicidal and  no suicidal ideation.    Review of Systems  Constitutional: Negative.   HENT: Negative.   Eyes: Negative.   Respiratory: Negative.   Cardiovascular: Negative.   Gastrointestinal: Positive for abdominal pain.  Musculoskeletal: Negative.   Skin: Negative.   Neurological: Negative.   Psychiatric/Behavioral: Negative for depression, hallucinations, memory loss, substance abuse and suicidal ideas. The patient is not nervous/anxious and does not have insomnia.     Blood pressure 112/61, pulse 64, temperature 97.7 F (36.5 C), temperature source Oral, resp. rate 16, height 5' (1.524 m), weight 90.7 kg (200 lb), SpO2 100 %.Body mass index is 39.06 kg/m.  General Appearance: Fairly Groomed  Eye Contact:  Good  Speech:  Clear and Coherent  Volume:  Normal  Mood:  Anxious  Affect:  Congruent  Thought Process:  Goal Directed  Orientation:  Full (Time, Place, and Person)  Thought Content:  Logical  Suicidal Thoughts:  No  Homicidal Thoughts:  No  Memory:  Immediate;   Fair Recent;   Fair Remote;   Fair  Judgement:  Fair  Insight:  Fair  Psychomotor Activity:  Normal  Concentration:  Concentration: Fair  Recall:  [employer] of Knowledge:  Fair  Language:  Fair  Akathisia:  No  Handed:  Right  AIMS (if indicated):     Assets:  Communication Skills Housing Physical Health Resilience Social Support  ADL's:  Intact  Cognition:  WNL  Sleep:        Treatment Plan Summary: Plan 21 year old woman who took an overdose of ibuprofen the night before last. Behavior afterwards did not suggest any actual wish to kill her self. Patient is currently denying symptoms of major depression. Sound like she's had a history of some impulsivity and needs to work on controlling that. Patient is very lucid right now and is requesting to go home once she is medically stable. She promises that she will go to outpatient therapy. She states in fact that she really wants to go back and see someone at [facility].  Patient was educated about ways to work on controlling impulsive behavior. She promises that if suicidal thoughts arise again she will make an effort to stop herself and get some help. I don't think at this point she meets commitment criteria anymore. Patient can be released from commitment and released from the emergency room once the ER physician and is satisfied with her medical stability. She will be given referral information to [facility] and encouraged to follow up for therapy and medical evaluation.  Disposition: No evidence of imminent risk to self or others at present.   Patient does not meet criteria for psychiatric inpatient admission. Supportive therapy provided about ongoing stressors.  Alethia Berthold, MD 05/08/2017 2:20 PM

## 2017-05-08 NOTE — BH Assessment (Signed)
Assessment Note  Dawn Richards is an 21 y.o. female presenting to the ED under IVC after taking an overdose of Ibuprofen two days ago.  Pt reports she got into a argument with her dad and on impulse, took "a bunch of pills and drank a lot of alcohol.  She also reports feeling depressed due to having thoughts about her deceased mother.  Pt denies wanting to harm herself while in the ED.  Pt reports past SI attempts bus states she has never been hospitalized and is not currently receiving outpatient mental health services. Pt's UDS was positive for cannabis.  Diagnosis: Major Depressive Disorder  Past Medical History:  Past Medical History:  Diagnosis Date  . DUB (dysfunctional uterine bleeding)   . Pelvic pain     Past Surgical History:  Procedure Laterality Date  . NO PAST SURGERIES      Family History:  Family History  Problem Relation Age of Onset  . Breast cancer Neg Hx   . Ovarian cancer Neg Hx   . Colon cancer Neg Hx   . Diabetes Neg Hx   . Heart disease Neg Hx     Social History:  reports that she has never smoked. She has never used smokeless tobacco. She reports that she drinks alcohol. She reports that she does not use drugs.  Additional Social History:  Alcohol / Drug Use Pain Medications: See PTA Prescriptions: See PTA Over the Counter: See PTA History of alcohol / drug use?: No history of alcohol / drug abuse  CIWA: CIWA-Ar BP: 128/67 Pulse Rate: 90 COWS:    Allergies: No Known Allergies  Home Medications:  (Not in a hospital admission)  OB/GYN Status:  No LMP recorded. Patient is not currently having periods (Reason: Irregular Periods).  General Assessment Data Location of Assessment: [facility] ED TTS Assessment: In system Is this a Tele or Face-to-Face Assessment?: Face-to-Face Is this an Initial Assessment or a Re-assessment for this encounter?: Initial Assessment Marital status: Single Maiden name: n/a Is patient pregnant?: No Pregnancy  Status: No Living Arrangements: Parent Can pt return to current living arrangement?: Yes Admission Status: Involuntary Is patient capable of signing voluntary admission?: No (Pt is a minor) Referral Source: Self/Family/Friend Insurance type: None  Medical Screening Exam [facility] Walk-in ONLY) Medical Exam completed: Yes  Crisis Care Plan Living Arrangements: Parent Legal Guardian: Other: (self) Name of Psychiatrist: none reported Name of Therapist: none reported  Education Status Is patient currently in school?: No Current Grade: na Highest grade of school patient has completed: 12 Name of school: na Contact person: na  Risk to self with the past 6 months Suicidal Ideation: Yes-Currently Present Has patient been a risk to self within the past 6 months prior to admission? : No Suicidal Intent: Yes-Currently Present Has patient had any suicidal intent within the past 6 months prior to admission? : No Is patient at risk for suicide?: Yes Suicidal Plan?: Yes-Currently Present Has patient had any suicidal plan within the past 6 months prior to admission? : No Specify Current Suicidal Plan: Pt took an overdose of pills Access to Means: Yes Specify Access to Suicidal Means: pt has access to pills What has been your use of drugs/alcohol within the last 12 months?: alcohol Previous Attempts/Gestures: Yes How many times?: 1 Other Self Harm Risks: none identified Triggers for Past Attempts: Anniversary Intentional Self Injurious Behavior: Cutting Comment - Self Injurious Behavior: Pt reports hx of cutting Family Suicide History: No Recent stressful life event(s): Conflict (Comment),  Loss (Comment) Persecutory voices/beliefs?: No Depression: Yes Depression Symptoms: Loss of interest in usual pleasures, Feeling worthless/self pity, Tearfulness Substance abuse history and/or treatment for substance abuse?: No Suicide prevention information given to non-admitted patients: Not  applicable  Risk to Others within the past 6 months Homicidal Ideation: No Does patient have any lifetime risk of violence toward others beyond the six months prior to admission? : No Thoughts of Harm to Others: No Current Homicidal Intent: No Current Homicidal Plan: No Access to Homicidal Means: No Identified Victim: none identified History of harm to others?: No Assessment of Violence: None Noted Violent Behavior Description: none identified Does patient have access to weapons?: No Criminal Charges Pending?: No Does patient have a court date: No Is patient on probation?: No  Psychosis Hallucinations: None noted Delusions: None noted  Mental Status Report Appearance/Hygiene: In scrubs Eye Contact: Fair Motor Activity: Freedom of movement Speech: Logical/coherent Level of Consciousness: Drowsy Mood: Depressed Affect: Anxious, Appropriate to circumstance, Depressed Anxiety Level: Minimal Thought Processes: Relevant Judgement: Impaired Orientation: Person, Place, Time, Situation Obsessive Compulsive Thoughts/Behaviors: None  Cognitive Functioning Concentration: Normal Memory: Recent Intact, Remote Intact IQ: Average Insight: Poor Impulse Control: Poor Appetite: Fair Weight Loss: 0 Weight Gain: 0 Sleep: No Change Total Hours of Sleep: 8 Vegetative Symptoms: None  ADLScreening [facility]) Patient's cognitive ability adequate to safely complete daily activities?: Yes Patient able to express need for assistance with ADLs?: Yes Independently performs ADLs?: Yes (appropriate for developmental age)  Prior Inpatient Therapy Prior Inpatient Therapy: No Prior Therapy Dates: na Prior Therapy Facilty/Provider(s): na Reason for Treatment: na  Prior Outpatient Therapy Prior Outpatient Therapy: No Prior Therapy Dates: na Prior Therapy Facilty/Provider(s): na Reason for Treatment: na Does patient have an ACCT team?: No Does patient have Intensive In-House  Services?  : No Does patient have [facility] services? : No Does patient have P4CC services?: No  ADL Screening (condition at time of admission) Patient's cognitive ability adequate to safely complete daily activities?: Yes Patient able to express need for assistance with ADLs?: Yes Independently performs ADLs?: Yes (appropriate for developmental age)       Abuse/Neglect Assessment (Assessment to be complete while patient is alone) Physical Abuse: Denies Verbal Abuse: Denies Sexual Abuse: Denies Exploitation of patient/patient's resources: Denies Self-Neglect: Denies Values / Beliefs Cultural Requests During Hospitalization: None Spiritual Requests During Hospitalization: None Consults Spiritual Care Consult Needed: No Social Work Consult Needed: No Merchant navy officer (For Healthcare) Does Patient Have a Medical Advance Directive?: No Would patient like information on creating a medical advance directive?: No - Patient declined    Additional Information 1:1 In Past 12 Months?: No CIRT Risk: No Elopement Risk: No Does patient have medical clearance?: No     Disposition:  Disposition Initial Assessment Completed for this Encounter: Yes Disposition of Patient: Inpatient treatment program Type of inpatient treatment program: Adult  On Site Evaluation by:   Reviewed with Physician:    [place] 05/08/2017 7:21 AM

## 2017-05-08 NOTE — ED Notes (Signed)
Updated poision control on pts status.

## 2017-05-08 NOTE — ED Provider Notes (Signed)
-----------------------------------------   2:49 PM on 05/08/2017 -----------------------------------------  Patient has been evaluated by Dr. Toni Amend and is cleared for discharge home.  as noted elsewhere and affect, her bicarbonate remains borderline low on the BMP but pH and bicarb on VBG are normal.  she has been tolerating by mouth, has no symptoms at this time, and her vital signs are normal.  No medical indication for further ED observation or admission. Per psych recommendations will discharge home.   Dionne Bucy, MD 05/08/17 1452

## 2017-05-08 NOTE — ED Notes (Signed)
Pt denies any n/v. Has been eating and drinking. Medically cleared. Finished fluids. Ready for discharge.

## 2017-05-08 NOTE — ED Notes (Signed)
Pt getting fluids, will check bmp in 3 hour per Dr. Seth Bake.

## 2017-05-08 NOTE — ED Notes (Signed)
Father's name and phone number placed on chart. Parents are leaving and will call using the password tomorrow

## 2017-07-24 ENCOUNTER — Other Ambulatory Visit: Payer: Self-pay

## 2017-07-24 ENCOUNTER — Emergency Department
Admission: EM | Admit: 2017-07-24 | Discharge: 2017-07-24 | Disposition: A | Discharge status: Home / Self Care | Payer: Self-pay | Attending: Emergency Medicine | Admitting: Emergency Medicine

## 2017-07-24 ENCOUNTER — Encounter: Payer: Self-pay | Admitting: Emergency Medicine

## 2017-07-24 DIAGNOSIS — R1030 Lower abdominal pain, unspecified: Secondary | ICD-10-CM | POA: Insufficient documentation

## 2017-07-24 DIAGNOSIS — N939 Abnormal uterine and vaginal bleeding, unspecified: Secondary | ICD-10-CM | POA: Insufficient documentation

## 2017-07-24 DIAGNOSIS — N96 Recurrent pregnancy loss: Secondary | ICD-10-CM | POA: Insufficient documentation

## 2017-07-24 DIAGNOSIS — Z3202 Encounter for pregnancy test, result negative: Secondary | ICD-10-CM | POA: Insufficient documentation

## 2017-07-24 LAB — COMPREHENSIVE METABOLIC PANEL
ALT: 21 U/L (ref 14–54)
AST: 23 U/L (ref 15–41)
Albumin: 3.6 g/dL (ref 3.5–5.0)
Alkaline Phosphatase: 71 U/L (ref 38–126)
Anion gap: 6 (ref 5–15)
BILIRUBIN TOTAL: 0.5 mg/dL (ref 0.3–1.2)
BUN: 9 mg/dL (ref 6–20)
CHLORIDE: 108 mmol/L (ref 101–111)
CO2: 23 mmol/L (ref 22–32)
CREATININE: 0.49 mg/dL (ref 0.44–1.00)
Calcium: 8.6 mg/dL — ABNORMAL LOW (ref 8.9–10.3)
Glucose, Bld: 132 mg/dL — ABNORMAL HIGH (ref 65–99)
POTASSIUM: 3.6 mmol/L (ref 3.5–5.1)
Sodium: 137 mmol/L (ref 135–145)
TOTAL PROTEIN: 7.3 g/dL (ref 6.5–8.1)

## 2017-07-24 LAB — CBC WITH DIFFERENTIAL/PLATELET
BASOS ABS: 0 10*3/uL (ref 0–0.1)
Basophils Relative: 0 %
EOS PCT: 3 %
Eosinophils Absolute: 0.2 10*3/uL (ref 0–0.7)
HEMATOCRIT: 37.4 % (ref 35.0–47.0)
Hemoglobin: 12.5 g/dL (ref 12.0–16.0)
LYMPHS ABS: 1.9 10*3/uL (ref 1.0–3.6)
LYMPHS PCT: 28 %
MCH: 28.4 pg (ref 26.0–34.0)
MCHC: 33.4 g/dL (ref 32.0–36.0)
MCV: 84.8 fL (ref 80.0–100.0)
MONO ABS: 0.4 10*3/uL (ref 0.2–0.9)
MONOS PCT: 5 %
NEUTROS ABS: 4.4 10*3/uL (ref 1.4–6.5)
Neutrophils Relative %: 64 %
PLATELETS: 263 10*3/uL (ref 150–440)
RBC: 4.41 MIL/uL (ref 3.80–5.20)
RDW: 12.6 % (ref 11.5–14.5)
WBC: 7 10*3/uL (ref 3.6–11.0)

## 2017-07-24 LAB — HCG, QUANTITATIVE, PREGNANCY: HCG, BETA CHAIN, QUANT, S: 2 m[IU]/mL (ref <5)

## 2017-07-24 NOTE — ED Provider Notes (Signed)
[facility] Emergency Department Provider Note       Time seen: ----------------------------------------- 10:07 AM on 07/24/2017 -----------------------------------------   I have reviewed the triage vital signs and the nursing notes.  HISTORY   Chief Complaint Abdominal Pain    HPI Dawn Richards is a 21 y.o. female with a history of pelvic pain who presents to the ED for vaginal bleeding and pregnancy.  Patient thinks she had a miscarriage this morning as she states she saw what looked like a gestational sac.  Her 2 previous pregnancies were both by miscarriage.  She is G3P0 AB 2.  She is having cramping lower abdominal pain and bleeding although not severe right now.  Past Medical History:  Diagnosis Date  . DUB (dysfunctional uterine bleeding)   . Pelvic pain     Patient Active Problem List   Diagnosis Date Noted  . 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 NSAID overdose, intentional self-harm, sequela (HCC) 05/08/2017  . 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 Dysmenorrhea 08/13/2016  . Abnormal uterine bleeding (AUB) 08/13/2016    Past Surgical History:  Procedure Laterality Date  . NO PAST SURGERIES      Allergies Patient has no known allergies.  Social History Social History   Tobacco Use  . Smoking status: Never Smoker  . Smokeless tobacco: Never Used  Substance Use Topics  . Alcohol use: Yes    Alcohol/week: 0.0 oz    Comment: occas  . Drug use: No    Review of Systems Constitutional: Negative for fever. Eyes: Negative for vision changes ENT:  Negative for congestion, sore throat Cardiovascular: Negative for chest pain. Respiratory: Negative for shortness of breath. Gastrointestinal: Positive for abdominal pain Genitourinary: Positive for vaginal bleeding Musculoskeletal: Negative for back pain. Skin: Negative for rash. Neurological: Negative for headaches, focal weakness or  numbness.  All systems negative/normal/unremarkable except as stated in the HPI  ____________________________________________   PHYSICAL EXAM:  VITAL SIGNS: ED Triage Vitals  Enc Vitals Group     BP 07/24/17 1004 129/67     Pulse Rate 07/24/17 1004 82     Resp 07/24/17 1004 16     Temp 07/24/17 1004 98.2 F (36.8 C)     Temp Source 07/24/17 1004 Oral     SpO2 07/24/17 1004 97 %     Weight 07/24/17 1001 180 lb (81.6 kg)     Height 07/24/17 1001 5\' 1"  (1.549 m)     Head Circumference --      Peak Flow --      Pain Score 07/24/17 1001 7     Pain Loc --      Pain Edu? --      Excl. in GC? --     Constitutional: Alert and oriented. Well appearing and in no distress. Eyes: Conjunctivae are normal. Normal extraocular movements. ENT   Head: Normocephalic and atraumatic.   Nose: No congestion/rhinnorhea.   Mouth/Throat: Mucous membranes are moist.   Neck: No stridor. Cardiovascular: Normal rate, regular rhythm. No murmurs, rubs, or gallops. Respiratory: Normal respiratory effort without tachypnea nor retractions. Breath sounds are clear and equal bilaterally. No wheezes/rales/rhonchi. Gastrointestinal: Mild suprapubic tenderness, no rebound or guarding.  Normal bowel sounds. Musculoskeletal: Nontender with normal range of motion in extremities. No lower extremity tenderness nor edema. Neurologic:  Normal speech and language. No gross focal neurologic deficits are appreciated.  Skin:  Skin is warm, dry and intact. No rash noted. Psychiatric: Mood and  affect are normal. Speech and behavior are normal.  ____________________________________________  ED COURSE:  Pertinent labs & imaging results that were available during my care of the patient were reviewed by me and considered in my medical decision making (see chart for details). Patient presents for vaginal bleeding and pregnancy, we will assess with labs and imaging as indicated.    Procedures ____________________________________________   LABS (pertinent positives/negatives)  Labs Reviewed  COMPREHENSIVE METABOLIC PANEL - Abnormal; Notable for the following components:      Result Value   Glucose, Bld 132 (*)    Calcium 8.6 (*)    All other components within normal limits  CBC WITH DIFFERENTIAL/PLATELET  HCG, QUANTITATIVE, PREGNANCY  URINALYSIS, COMPLETE (UACMP) WITH MICROSCOPIC   ___________________________________________  DIFFERENTIAL DIAGNOSIS   Miscarriage, ectopic, threatened miscarriage, normal pregnancy  FINAL ASSESSMENT AND PLAN  Abnormal vaginal bleeding   Plan: Patient had presented for vaginal bleeding in pregnancy. Patient's labs are reassuring.  She did not require any imaging because she was not pregnant.  She is stable for outpatient follow-up with her doctor.   Emily FilbertWilliams, Carlene Bickley E, MD   Note: This note was generated in part or whole with voice recognition software. Voice recognition is usually quite accurate but there are transcription errors that can and very often do occur. I apologize for any typographical errors that were not detected and corrected.     Emily FilbertWilliams, Ly Wass E, MD 07/24/17 [phone]

## 2017-07-24 NOTE — ED Triage Notes (Signed)
G3 P0.  States had a miscarriage this morning.  Approximately 1 hour PTA.   States started having vaginal bleeding and cramping yesterday afternoon.  LMP:  05/2017.  Patient states has has irregular periods.  States did not know she was pregnant prior to yesterday.

## 2017-08-18 ENCOUNTER — Emergency Department
Admission: EM | Admit: 2017-08-18 | Discharge: 2017-08-18 | Disposition: A | Discharge status: Home / Self Care | Payer: No Typology Code available for payment source | Attending: Emergency Medicine | Admitting: Emergency Medicine

## 2017-08-18 ENCOUNTER — Encounter: Payer: Self-pay | Admitting: Emergency Medicine

## 2017-08-18 DIAGNOSIS — S161XXA Strain of muscle, fascia and tendon at neck level, initial encounter: Secondary | ICD-10-CM | POA: Diagnosis not present

## 2017-08-18 DIAGNOSIS — Y9389 Activity, other specified: Secondary | ICD-10-CM | POA: Diagnosis not present

## 2017-08-18 DIAGNOSIS — Z79899 Other long term (current) drug therapy: Secondary | ICD-10-CM | POA: Insufficient documentation

## 2017-08-18 DIAGNOSIS — F4325 Adjustment disorder with mixed disturbance of emotions and conduct: Secondary | ICD-10-CM | POA: Diagnosis not present

## 2017-08-18 DIAGNOSIS — Y998 Other external cause status: Secondary | ICD-10-CM | POA: Diagnosis not present

## 2017-08-18 DIAGNOSIS — Y92410 Unspecified street and highway as the place of occurrence of the external cause: Secondary | ICD-10-CM | POA: Diagnosis not present

## 2017-08-18 DIAGNOSIS — S199XXA Unspecified injury of neck, initial encounter: Secondary | ICD-10-CM | POA: Diagnosis present

## 2017-08-18 MED ORDER — METHOCARBAMOL 500 MG PO TABS
1000.0000 mg | ORAL_TABLET | Freq: Once | ORAL | Status: AC
  Administered 2017-08-18: 1000 mg via ORAL
  Filled 2017-08-18: qty 2

## 2017-08-18 MED ORDER — KETOROLAC TROMETHAMINE 30 MG/ML IJ SOLN
30.0000 mg | Freq: Once | INTRAMUSCULAR | Status: AC
  Administered 2017-08-18: 30 mg via INTRAMUSCULAR
  Filled 2017-08-18: qty 1

## 2017-08-18 MED ORDER — PREDNISONE 50 MG PO TABS: 50.0000 mg | ORAL_TABLET | Freq: Every day | ORAL | 0 refills | Status: DC

## 2017-08-18 MED ORDER — METHOCARBAMOL 500 MG PO TABS: 500.0000 mg | ORAL_TABLET | Freq: Four times a day (QID) | ORAL | 0 refills | Status: DC
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
## 2017-12-22 LAB — HM HIV SCREENING LAB: HM HIV Screening: NEGATIVE

## 2018-01-01 ENCOUNTER — Emergency Department
Admission: EM | Admit: 2018-01-01 | Discharge: 2018-01-01 | Disposition: A | Discharge status: Home / Self Care | Payer: BLUE CROSS/BLUE SHIELD | Attending: Emergency Medicine | Admitting: Emergency Medicine

## 2018-01-01 DIAGNOSIS — L02413 Cutaneous abscess of right upper limb: Secondary | ICD-10-CM | POA: Insufficient documentation

## 2018-01-01 DIAGNOSIS — L03113 Cellulitis of right upper limb: Secondary | ICD-10-CM | POA: Diagnosis not present

## 2018-01-01 DIAGNOSIS — Z79899 Other long term (current) drug therapy: Secondary | ICD-10-CM | POA: Diagnosis not present

## 2018-01-01 DIAGNOSIS — L0291 Cutaneous abscess, unspecified: Secondary | ICD-10-CM | POA: Diagnosis present

## 2018-01-01 MED ORDER — SULFAMETHOXAZOLE-TRIMETHOPRIM 800-160 MG PO TABS
1.0000 | ORAL_TABLET | Freq: Once | ORAL | Status: AC
  Administered 2018-01-01: 1 via ORAL
  Filled 2018-01-01: qty 1

## 2018-01-01 MED ORDER — HYDROCODONE-ACETAMINOPHEN 5-325 MG PO TABS
1.0000 | ORAL_TABLET | ORAL | Status: AC
  Administered 2018-01-01: 1 via ORAL
  Filled 2018-01-01: qty 1

## 2018-01-01 MED ORDER — LIDOCAINE HCL (PF) 1 % IJ SOLN
2.0000 mL | Freq: Once | INTRAMUSCULAR | Status: AC
  Administered 2018-01-01: 2 mL
  Filled 2018-01-01: qty 5

## 2018-01-01 MED ORDER — SULFAMETHOXAZOLE-TRIMETHOPRIM 800-160 MG PO TABS: 1.0000 | ORAL_TABLET | Freq: Two times a day (BID) | ORAL | 0 refills | Status: DC

## 2018-01-01 MED ORDER — TRAMADOL HCL 50 MG PO TABS: 50.0000 mg | ORAL_TABLET | Freq: Four times a day (QID) | ORAL | 0 refills | Status: DC | PRN

## 2018-01-01 NOTE — ED Provider Notes (Signed)
[facility] EMERGENCY DEPARTMENT Provider Note   CSN: [id number] Arrival date & time: 01/01/18  1854     History   Chief Complaint Chief Complaint  Patient presents with  . Abscess    HPI Dawn Richards is a 22 y.o. female.  Presents to the emergency department for evaluation of abscess to the right arm x1 week.  Patient has a fluctuant abscess to the volar proximal aspect of the right forearm that has been increasing in pain and size.  There is been no drainage.  She denies any bug bites.  She has not been taking any antibiotics.  Her pain is moderate.  No fevers.  She denies any elbow pain or swelling.  HPI  Past Medical History:  Diagnosis Date  . DUB (dysfunctional uterine bleeding)   . Pelvic pain     Patient Active Problem List   Diagnosis Date Noted  . 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 NSAID overdose, intentional self-harm, sequela (HCC) 05/08/2017  . 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 Dysmenorrhea 08/13/2016  . Abnormal uterine bleeding (AUB) 08/13/2016    Past Surgical History:  Procedure Laterality Date  . NO PAST SURGERIES       OB History    Gravida  0   Para  0   Term  0   Preterm  0   AB  0   Living  0     SAB  0   TAB  0   Ectopic  0   Multiple  0   Live Births               Home Medications    Prior to Admission medications   Medication Sig Start Date End Date Taking? Authorizing Provider  methocarbamol (ROBAXIN) 500 MG tablet Take 1 tablet (500 mg total) by mouth 4 (four) times daily. 08/18/17   Cuthriell, Delorise Royals, PA-C  Norethindrone-Ethinyl Estradiol-Fe Biphas (LO LOESTRIN FE) 1 MG-10 MCG / 10 MCG tablet Take 1 tablet by mouth daily. 08/13/16   Defrancesco, Prentice Docker, MD  predniSONE (DELTASONE) 50 MG tablet Take 1 tablet (50 mg total) by mouth daily with breakfast. 08/18/17   Cuthriell, Delorise Royals, PA-C  sulfamethoxazole-trimethoprim (BACTRIM DS,SEPTRA  DS) 800-160 MG tablet Take 1 tablet by mouth 2 (two) times daily. 01/01/18   Evon Slack, PA-C  traMADol (ULTRAM) 50 MG tablet Take 1 tablet (50 mg total) by mouth every 6 (six) hours as needed. 01/01/18 01/01/19  Evon Slack, PA-C    Family History Family History  Problem Relation Age of Onset  . Breast cancer Neg Hx   . Ovarian cancer Neg Hx   . Colon cancer Neg Hx   . Diabetes Neg Hx   . Heart disease Neg Hx     Social History Social History   Tobacco Use  . Smoking status: Never Smoker  . Smokeless tobacco: Never Used  Substance Use Topics  . Alcohol use: Yes    Alcohol/week: 0.0 oz    Comment: occas  . Drug use: No     Allergies   Patient has no known allergies.   Review of Systems Review of Systems  Constitutional: Negative for fever.  Respiratory: Negative for shortness of breath.   Cardiovascular: Negative for chest pain.  Gastrointestinal: Negative for abdominal pain.  Musculoskeletal: Negative for back pain and myalgias.  Skin: Positive for wound. Negative for rash.  Neurological: Negative for dizziness and headaches.  Physical Exam Updated Vital Signs BP 137/81 (BP Location: Left Arm)   Pulse 84   Temp 97.8 F (36.6 C) (Oral)   Resp 18   Ht 5' (1.524 m)   Wt 92.5 kg (204 lb)   SpO2 99%   BMI 39.84 kg/m   Physical Exam  Constitutional: She is oriented to person, place, and time. She appears well-developed and well-nourished.  HENT:  Head: Normocephalic and atraumatic.  Eyes: Conjunctivae are normal.  Neck: Normal range of motion.  Cardiovascular: Normal rate.  Pulmonary/Chest: Effort normal. No respiratory distress.  Musculoskeletal:  Examination of the right upper extremity shows 1 cm area of fluctuance surrounded b 6 cm x 12 cm of erythema proximal volar aspect of the right forearm.  Central area of fluctuance is tender to palpation.  There is no drainage.  There is no elbow effusion, warmth or redness.  She has full range of  motion the right elbow.  Neurological: She is alert and oriented to person, place, and time.  Skin: Skin is warm. Rash noted. There is erythema.  Psychiatric: She has a normal mood and affect. Her behavior is normal. Thought content normal.     ED Treatments / Results  Labs (all labs ordered are listed, but only abnormal results are displayed) Labs Reviewed - No data to display  EKG None  Radiology No results found.  Procedures .Marland KitchenIncision and Drainage Date/Time: 01/01/2018 9:14 PM Performed by: Evon Slack, PA-C Authorized by: Evon Slack, PA-C   Consent:    Consent obtained:  Verbal   Consent given by:  Patient   Risks discussed:  Incomplete drainage and pain   Alternatives discussed:  No treatment Location:    Type:  Abscess   Location:  Upper extremity   Upper extremity location:  Arm   Arm location:  R lower arm Pre-procedure details:    Skin preparation:  Betadine Anesthesia (see MAR for exact dosages):    Anesthesia method:  Local infiltration   Local anesthetic:  Lidocaine 1% w/o epi Procedure type:    Complexity:  Simple Procedure details:    Incision types:  Single straight   Incision depth:  Dermal   Scalpel blade:  11   Wound management:  Probed and deloculated   Drainage:  Purulent   Drainage amount:  Moderate   Wound treatment:  Wound left open   Packing materials:  1/4 in gauze   Amount 1/4":  2 inches Post-procedure details:    Patient tolerance of procedure:  Tolerated well, no immediate complications   (including critical care time)  Medications Ordered in ED Medications  HYDROcodone-acetaminophen (NORCO/VICODIN) 5-325 MG per tablet 1 tablet (1 tablet Oral Given 01/01/18 2022)  lidocaine (PF) (XYLOCAINE) 1 % injection 2 mL (2 mLs Infiltration Given by Other 01/01/18 2022)  sulfamethoxazole-trimethoprim (BACTRIM DS,SEPTRA DS) 800-160 MG per tablet 1 tablet (1 tablet Oral Given 01/01/18 2022)     Initial Impression / Assessment and  Plan / ED Course  I have reviewed the triage vital signs and the nursing notes.  Pertinent labs & imaging results that were available during my care of the patient were reviewed by me and considered in my medical decision making (see chart for details).     22 year old female with abscess with very mild surrounding cellulitis to the right forearm.  Incision and drainage procedures performed and iodoform packing applied.  Patient is placed on Bactrim DS.  Patient's vital signs are stable, afebrile.  She will follow-up in  2 to 3 days for packing removal and wound check.  No signs of septic joint or compartment syndrome.  Final Clinical Impressions(s) / ED Diagnoses   Final diagnoses:  Abscess of right forearm  Cellulitis of right forearm    ED Discharge Orders        Ordered    sulfamethoxazole-trimethoprim (BACTRIM DS,SEPTRA DS) 800-160 MG tablet  2 times daily     01/01/18 2111    traMADol (ULTRAM) 50 MG tablet  Every 6 hours PRN     01/01/18 2111       Ronnette Juniper 01/01/18 2116    Sharyn Creamer, MD 01/03/18 1606

## 2018-01-01 NOTE — ED Notes (Signed)

## 2018-01-01 NOTE — Discharge Instructions (Addendum)
Please follow-up with walk-in clinic, urgent care or primary care provider in 2 to 3 days for packing removal and wound check.  Take antibiotics as prescribed.  If any fevers, increasing pain, swelling or redness return to the ED.

## 2018-01-01 NOTE — ED Triage Notes (Signed)
Patient c/o abscess to left forearm for 1 week. Area of redness noted around abscess. Patient denies fever, chills, N/V.

## 2018-01-05 ENCOUNTER — Encounter: Payer: Self-pay | Admitting: Emergency Medicine

## 2018-01-05 ENCOUNTER — Other Ambulatory Visit: Payer: Self-pay

## 2018-01-05 ENCOUNTER — Emergency Department
Admission: EM | Admit: 2018-01-05 | Discharge: 2018-01-05 | Disposition: A | Discharge status: Home / Self Care | Payer: BLUE CROSS/BLUE SHIELD | Attending: Emergency Medicine | Admitting: Emergency Medicine

## 2018-01-05 DIAGNOSIS — Z79899 Other long term (current) drug therapy: Secondary | ICD-10-CM | POA: Insufficient documentation

## 2018-01-05 DIAGNOSIS — Z4800 Encounter for change or removal of nonsurgical wound dressing: Secondary | ICD-10-CM | POA: Diagnosis not present

## 2018-01-05 DIAGNOSIS — Z09 Encounter for follow-up examination after completed treatment for conditions other than malignant neoplasm: Secondary | ICD-10-CM

## 2018-01-05 DIAGNOSIS — L2082 Flexural eczema: Secondary | ICD-10-CM | POA: Diagnosis not present

## 2018-01-05 MED ORDER — TRIAMCINOLONE ACETONIDE 0.1 % EX OINT: 1.0000 "application " | TOPICAL_OINTMENT | Freq: Two times a day (BID) | CUTANEOUS | 1 refills | Status: DC

## 2018-01-05 NOTE — ED Provider Notes (Signed)
[facility] Emergency Department Provider Note ____________________________________________  Time seen: 1630  I have reviewed the triage vital signs and the nursing notes.  HISTORY  Chief Complaint  Wound Check  HPI Dawn Richards is a 22 y.o. female presents herself to the ED for wound check.  Patient is 3 days status post 90 procedure to her right dorsal forearm for local abscess and cellulitis.  She has been tolerating the antibiotics as previously prescribed and reports improvement of her symptoms.  No interim complaints are noted.  Is a secondary concern for a small circular patch of itchy, flaky skin to the volar right forearm.  She reports areas been inflamed for several years.  Past Medical History:  Diagnosis Date  . DUB (dysfunctional uterine bleeding)   . Pelvic pain     Patient Active Problem List   Diagnosis Date Noted  . 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 NSAID overdose, intentional self-harm, sequela (HCC) 05/08/2017  . 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 Dysmenorrhea 08/13/2016  . Abnormal uterine bleeding (AUB) 08/13/2016    Past Surgical History:  Procedure Laterality Date  . NO PAST SURGERIES      Prior to Admission medications   Medication Sig Start Date End Date Taking? Authorizing Provider  Norethindrone-Ethinyl Estradiol-Fe Biphas (LO LOESTRIN FE) 1 MG-10 MCG / 10 MCG tablet Take 1 tablet by mouth daily. 08/13/16   Defrancesco, Prentice Docker, MD  sulfamethoxazole-trimethoprim (BACTRIM DS,SEPTRA DS) 800-160 MG tablet Take 1 tablet by mouth 2 (two) times daily. 01/01/18   Evon Slack, PA-C  traMADol (ULTRAM) 50 MG tablet Take 1 tablet (50 mg total) by mouth every 6 (six) hours as needed. 01/01/18 01/01/19  Evon Slack, PA-C  triamcinolone ointment (KENALOG) 0.1 % Apply 1 application topically 2 (two) times daily. 01/05/18   Willene Holian, Charlesetta Ivory, PA-C   Allergies Patient has no  known allergies.  Family History  Problem Relation Age of Onset  . Breast cancer Neg Hx   . Ovarian cancer Neg Hx   . Colon cancer Neg Hx   . Diabetes Neg Hx   . Heart disease Neg Hx     Social History Social History   Tobacco Use  . Smoking status: Never Smoker  . Smokeless tobacco: Never Used  Substance Use Topics  . Alcohol use: Yes    Alcohol/week: 0.0 oz    Comment: occas  . Drug use: No    Review of Systems  Constitutional: Negative for fever. Cardiovascular: Negative for chest pain. Respiratory: Negative for shortness of breath. Musculoskeletal: Negative for back pain. Skin: Positive for rash.  Cellulitis as above. Neurological: Negative for headaches, focal weakness or numbness. ____________________________________________  PHYSICAL EXAM:  VITAL SIGNS: ED Triage Vitals  Enc Vitals Group     BP 01/05/18 1612 136/74     Pulse Rate 01/05/18 1612 76     Resp 01/05/18 1612 18     Temp 01/05/18 1612 98.2 F (36.8 C)     Temp Source 01/05/18 1612 Oral     SpO2 01/05/18 1612 99 %     Weight 01/05/18 1611 204 lb (92.5 kg)     Height 01/05/18 1611 5' (1.524 m)     Head Circumference --      Peak Flow --      Pain Score 01/05/18 1611 8     Pain Loc --      Pain Edu? --  Excl. in GC? --     Constitutional: Alert and oriented. Well appearing and in no distress. Head: Normocephalic and atraumatic. Cardiovascular: Normal rate, regular rhythm. Normal distal pulses. Respiratory: Normal respiratory effort.  Musculoskeletal: Nontender with normal range of motion in all extremities.  Neurologic:  Normal gait without ataxia. Normal speech and language. No gross focal neurologic deficits are appreciated. Skin:  Skin is warm, dry and intact.  Patient with a healing abscess status post I&D procedure to the dorsal right elbow.  No surrounding erythema is appreciated.  Wound packing is removed and the wound flushes clear with saline.  Additionally has a 3 cm circular  lesion to the volar forearm consistent with a likely eczema patch.  ____________________________________________  PROCEDURES  Procedures ____________________________________________  INITIAL IMPRESSION / ASSESSMENT AND PLAN / ED COURSE  Patient with ED evaluation for wound check status post I&D procedure.  Wound is healing well.  Patient will be advised to continue the antibiotics previously prescribed.  She also has an area of eczema to the right volar forearm which will be treated with a steroid ointment.  Patient is advised to can continue to keep the open wound clean, dry, and covered.  She should return to the ED as needed for wound check otherwise she is referred to [facility] for interim wound check. ____________________________________________  FINAL CLINICAL IMPRESSION(S) / ED DIAGNOSES  Final diagnoses:  Encounter for recheck of abscess following incision and drainage  Flexural eczema      Tiwan Schnitker, Charlesetta Ivory, PA-C 01/05/18 1643    Sharyn Creamer, MD 01/05/18 2136

## 2018-01-05 NOTE — ED Triage Notes (Addendum)
Pt here for packing removal from insect bite on Friday.  Pt has bandaid in place. Pt still taking anitbiotics

## 2018-01-05 NOTE — Discharge Instructions (Signed)
You should continue to take the antibiotic that was previously prescribed. Keep the wound clean, dry, and covered. Follow-up with [facility] for wound check in I week, if needed. Use the steroid cream, once or twice a day as needed.

## 2018-01-05 NOTE — ED Notes (Signed)
See triage note states she is here for wound check  Had abscess lanced couple of days ago

## 2018-07-01 ENCOUNTER — Encounter: Payer: Self-pay | Admitting: Emergency Medicine

## 2018-07-01 ENCOUNTER — Emergency Department
Admission: EM | Admit: 2018-07-01 | Discharge: 2018-07-01 | Disposition: A | Discharge status: Home / Self Care | Payer: BLUE CROSS/BLUE SHIELD | Attending: Emergency Medicine | Admitting: Emergency Medicine

## 2018-07-01 ENCOUNTER — Other Ambulatory Visit: Payer: Self-pay

## 2018-07-01 ENCOUNTER — Emergency Department: Payer: BLUE CROSS/BLUE SHIELD

## 2018-07-01 DIAGNOSIS — N760 Acute vaginitis: Secondary | ICD-10-CM | POA: Insufficient documentation

## 2018-07-01 DIAGNOSIS — N730 Acute parametritis and pelvic cellulitis: Secondary | ICD-10-CM | POA: Insufficient documentation

## 2018-07-01 DIAGNOSIS — B9689 Other specified bacterial agents as the cause of diseases classified elsewhere: Secondary | ICD-10-CM | POA: Insufficient documentation

## 2018-07-01 DIAGNOSIS — K8080 Other cholelithiasis without obstruction: Secondary | ICD-10-CM | POA: Insufficient documentation

## 2018-07-01 DIAGNOSIS — Z79899 Other long term (current) drug therapy: Secondary | ICD-10-CM | POA: Insufficient documentation

## 2018-07-01 DIAGNOSIS — R109 Unspecified abdominal pain: Secondary | ICD-10-CM

## 2018-07-01 DIAGNOSIS — A749 Chlamydial infection, unspecified: Secondary | ICD-10-CM | POA: Insufficient documentation

## 2018-07-01 DIAGNOSIS — R1084 Generalized abdominal pain: Secondary | ICD-10-CM | POA: Diagnosis present

## 2018-07-01 LAB — URINALYSIS, ROUTINE W REFLEX MICROSCOPIC
Bacteria, UA: NONE SEEN
Bilirubin Urine: NEGATIVE
Glucose, UA: NEGATIVE mg/dL
Ketones, ur: NEGATIVE mg/dL
LEUKOCYTES UA: NEGATIVE
Nitrite: NEGATIVE
PH: 6 (ref 5.0–8.0)
Protein, ur: NEGATIVE mg/dL
SPECIFIC GRAVITY, URINE: 1.02 (ref 1.005–1.030)

## 2018-07-01 LAB — CBC WITH DIFFERENTIAL/PLATELET
Abs Immature Granulocytes: 0.02 10*3/uL (ref 0.00–0.07)
Basophils Absolute: 0 10*3/uL (ref 0.0–0.1)
Basophils Relative: 0 %
EOS ABS: 0.4 10*3/uL (ref 0.0–0.5)
EOS PCT: 4 %
HEMATOCRIT: 40 % (ref 36.0–46.0)
Hemoglobin: 12.6 g/dL (ref 12.0–15.0)
IMMATURE GRANULOCYTES: 0 %
Lymphocytes Relative: 30 %
Lymphs Abs: 2.7 10*3/uL (ref 0.7–4.0)
MCH: 28.2 pg (ref 26.0–34.0)
MCHC: 31.5 g/dL (ref 30.0–36.0)
MCV: 89.5 fL (ref 80.0–100.0)
MONO ABS: 0.7 10*3/uL (ref 0.1–1.0)
MONOS PCT: 8 %
Neutro Abs: 5 10*3/uL (ref 1.7–7.7)
Neutrophils Relative %: 58 %
PLATELETS: 301 10*3/uL (ref 150–400)
RBC: 4.47 MIL/uL (ref 3.87–5.11)
RDW: 11.9 % (ref 11.5–15.5)
WBC: 8.7 10*3/uL (ref 4.0–10.5)
nRBC: 0 % (ref 0.0–0.2)

## 2018-07-01 LAB — COMPREHENSIVE METABOLIC PANEL
ALT: 15 U/L (ref 0–44)
AST: 18 U/L (ref 15–41)
Albumin: 3.9 g/dL (ref 3.5–5.0)
Alkaline Phosphatase: 69 U/L (ref 38–126)
Anion gap: 7 (ref 5–15)
BILIRUBIN TOTAL: 0.4 mg/dL (ref 0.3–1.2)
BUN: 12 mg/dL (ref 6–20)
CHLORIDE: 109 mmol/L (ref 98–111)
CO2: 25 mmol/L (ref 22–32)
Calcium: 9.1 mg/dL (ref 8.9–10.3)
Creatinine, Ser: 0.52 mg/dL (ref 0.44–1.00)
Glucose, Bld: 113 mg/dL — ABNORMAL HIGH (ref 70–99)
Potassium: 3.9 mmol/L (ref 3.5–5.1)
Sodium: 141 mmol/L (ref 135–145)
TOTAL PROTEIN: 7.6 g/dL (ref 6.5–8.1)

## 2018-07-01 LAB — WET PREP, GENITAL
Sperm: NONE SEEN
Trich, Wet Prep: NONE SEEN
Yeast Wet Prep HPF POC: NONE SEEN

## 2018-07-01 LAB — LIPASE, BLOOD: LIPASE: 30 U/L (ref 11–51)

## 2018-07-01 LAB — RAPID HIV SCREEN (HIV 1/2 AB+AG)
HIV 1/2 Antibodies: NONREACTIVE
HIV-1 P24 Antigen - HIV24: NONREACTIVE

## 2018-07-01 LAB — CHLAMYDIA/NGC RT PCR (ARMC ONLY)
Chlamydia Tr: DETECTED — AB
N gonorrhoeae: NOT DETECTED

## 2018-07-01 LAB — POCT PREGNANCY, URINE: Preg Test, Ur: NEGATIVE

## 2018-07-01 MED ORDER — METRONIDAZOLE 500 MG PO TABS
500.0000 mg | ORAL_TABLET | Freq: Once | ORAL | Status: AC
  Administered 2018-07-01: 500 mg via ORAL

## 2018-07-01 MED ORDER — DOXYCYCLINE HYCLATE 100 MG PO TABS
ORAL_TABLET | ORAL | Status: AC
  Filled 2018-07-01: qty 1

## 2018-07-01 MED ORDER — DOXYCYCLINE HYCLATE 100 MG PO TABS
100.0000 mg | ORAL_TABLET | Freq: Once | ORAL | Status: AC
  Administered 2018-07-01: 100 mg via ORAL

## 2018-07-01 MED ORDER — CEFTRIAXONE SODIUM 250 MG IJ SOLR
INTRAMUSCULAR | Status: AC
  Filled 2018-07-01: qty 250

## 2018-07-01 MED ORDER — METRONIDAZOLE 500 MG PO TABS
ORAL_TABLET | ORAL | Status: AC
  Filled 2018-07-01: qty 1

## 2018-07-01 MED ORDER — ONDANSETRON HCL 4 MG/2ML IJ SOLN
4.0000 mg | Freq: Once | INTRAMUSCULAR | Status: AC
  Administered 2018-07-01: 4 mg via INTRAVENOUS

## 2018-07-01 MED ORDER — ONDANSETRON HCL 4 MG/2ML IJ SOLN
INTRAMUSCULAR | Status: AC
  Filled 2018-07-01: qty 2

## 2018-07-01 MED ORDER — DEXTROSE 5 % IV SOLN
250.0000 mg | Freq: Once | INTRAVENOUS | Status: AC
  Administered 2018-07-01: 250 mg via INTRAVENOUS
  Filled 2018-07-01: qty 250

## 2018-07-01 MED ORDER — DOCUSATE SODIUM 100 MG PO CAPS: ORAL_CAPSULE | ORAL | 0 refills | Status: DC

## 2018-07-01 MED ORDER — HYDROCODONE-ACETAMINOPHEN 5-325 MG PO TABS: 1.0000 | ORAL_TABLET | ORAL | 0 refills | Status: DC | PRN

## 2018-07-01 MED ORDER — DOXYCYCLINE HYCLATE 100 MG PO CAPS: 100.0000 mg | ORAL_CAPSULE | Freq: Two times a day (BID) | ORAL | 0 refills | Status: DC

## 2018-07-01 MED ORDER — METRONIDAZOLE 500 MG PO TABS: 500.0000 mg | ORAL_TABLET | Freq: Two times a day (BID) | ORAL | 0 refills | Status: DC

## 2018-07-01 MED ORDER — MORPHINE SULFATE (PF) 4 MG/ML IV SOLN
INTRAVENOUS | Status: AC
  Filled 2018-07-01: qty 1

## 2018-07-01 MED ORDER — ONDANSETRON 4 MG PO TBDP: ORAL_TABLET | ORAL | 0 refills | Status: DC

## 2018-07-01 MED ORDER — MORPHINE SULFATE (PF) 4 MG/ML IV SOLN
4.0000 mg | Freq: Once | INTRAVENOUS | Status: AC
  Administered 2018-07-01: 4 mg via INTRAVENOUS

## 2018-07-01 NOTE — Discharge Instructions (Addendum)
You have been seen in the Emergency Department (ED) for abdominal pain.  Your evaluation suggests that your pain is caused by gallstones.  Fortunately you do not need immediate surgery at this time, but it is important that you follow up with a surgeon as an outpatient; typically surgical removal of the gallbladder is the only thing that will definitively fix your issue.  Read through the included information about a bland diet, and use any prescribed medications as instructed.  Avoid smoking and alcohol use. ? ?Please follow up as instructed above regarding today?s emergent visit and the symptoms that are bothering you. ? ?Take Norco as prescribed. Do not drink alcohol, drive or participate in any other potentially dangerous activities while taking this medication as it may make you sleepy. Do not take this medication with any other sedating medications, either prescription or over-the-counter. If you were prescribed Percocet or Vicodin, do not take these with acetaminophen (Tylenol) as it is already contained within these medications. ?  ?This medication is an opiate (or narcotic) pain medication and can be habit forming.  Use it as little as possible to achieve adequate pain control.  Do not use or use it with extreme caution if you have a history of opiate abuse or dependence.  If you are on a pain contract with your primary care doctor or a pain specialist, be sure to let them know you were prescribed this medication today from the  [facility].  This medication is intended for your use only - do not give any to anyone else and keep it in a secure place where nobody else, especially children, have access to it.  It will also cause or worsen constipation, so you may want to consider taking an over-the-counter stool softener while you are taking this medication. ? ?Return to the ED if your abdominal pain worsens or fails to improve, you develop bloody vomiting, bloody diarrhea, you are  unable to tolerate fluids due to vomiting, fever greater than 101, or other symptoms that concern you. ? ?

## 2018-07-01 NOTE — ED Provider Notes (Signed)
[facility] Emergency Department Provider Note  ____________________________________________   First MD Initiated Contact with Patient 07/01/18 [phone]     (approximate)  I have reviewed the triage vital signs and the nursing notes.   HISTORY  Chief Complaint Abdominal Pain    HPI Dawn Richards is a 22 y.o. female with medical history as listed below who presents for evaluation of a couple of days of abdominal pain that she describes is on the right side of her abdomen, both upper and lower, but made significantly worse when eating, and accompanied with nausea.  Occasionally radiates through into her back particularly in the middle part of her back.  She denies fever/chills, chest pain, shortness of breath, vomiting, diarrhea, dysuria, and hematuria.  She has no known history of gallstones or kidney stones.  She has a new sexual partner over the last month and she does not use condoms.  She denies any pain during intercourse and denies an excessive amount of vaginal discharge.  She describes the pain as severe at times but she does not appear to be in any distress currently.  Past Medical History:  Diagnosis Date  . DUB (dysfunctional uterine bleeding)   . Pelvic pain     Patient Active Problem List   Diagnosis Date Noted  . 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 NSAID overdose, intentional self-harm, sequela (HCC) 05/08/2017  . 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 Dysmenorrhea 08/13/2016  . Abnormal uterine bleeding (AUB) 08/13/2016    Past Surgical History:  Procedure Laterality Date  . NO PAST SURGERIES      Prior to Admission medications   Medication Sig Start Date End Date Taking? Authorizing Provider  Norethindrone-Ethinyl Estradiol-Fe Biphas (LO LOESTRIN FE) 1 MG-10 MCG / 10 MCG tablet Take 1 tablet by mouth daily. 08/13/16   Defrancesco, Prentice Docker, MD  sulfamethoxazole-trimethoprim (BACTRIM DS,SEPTRA  DS) 800-160 MG tablet Take 1 tablet by mouth 2 (two) times daily. 01/01/18   Evon Slack, PA-C  traMADol (ULTRAM) 50 MG tablet Take 1 tablet (50 mg total) by mouth every 6 (six) hours as needed. 01/01/18 01/01/19  Evon Slack, PA-C  triamcinolone ointment (KENALOG) 0.1 % Apply 1 application topically 2 (two) times daily. 01/05/18   Menshew, Charlesetta Ivory, PA-C    Allergies Patient has no known allergies.  Family History  Problem Relation Age of Onset  . Breast cancer Neg Hx   . Ovarian cancer Neg Hx   . Colon cancer Neg Hx   . Diabetes Neg Hx   . Heart disease Neg Hx     Social History Social History   Tobacco Use  . Smoking status: Never Smoker  . Smokeless tobacco: Never Used  Substance Use Topics  . Alcohol use: Yes    Alcohol/week: 0.0 standard drinks    Comment: occas  . Drug use: No    Review of Systems Constitutional: No fever/chills Eyes: No visual changes. ENT: No sore throat. Cardiovascular: Denies chest pain. Respiratory: Denies shortness of breath. Gastrointestinal: Abdominal pain primarily in the right side as described above.  Nausea, no vomiting, no diarrhea. Genitourinary: Negative for dysuria. no pain during intercourse, no excessive vaginal discharge Musculoskeletal: Negative for neck pain.  Negative for back pain. Integumentary: Negative for rash. Neurological: Negative for headaches, focal weakness or numbness.   ____________________________________________   PHYSICAL EXAM:  VITAL SIGNS: ED Triage Vitals  Enc Vitals Group     BP 07/01/18 0441 122/76  Pulse Rate 07/01/18 0441 74     Resp 07/01/18 0441 20     Temp 07/01/18 0441 98.1 F (36.7 C)     Temp Source 07/01/18 0441 Oral     SpO2 07/01/18 0441 99 %     Weight 07/01/18 0439 86.6 kg (191 lb)     Height 07/01/18 0439 1.524 m (5')     Head Circumference --      Peak Flow --      Pain Score 07/01/18 0439 8     Pain Loc --      Pain Edu? --      Excl. in GC? --      Constitutional: Alert and oriented. Well appearing and in no acute distress.  Listening to earbuds, using her phone, and talking with her parents who are at bedside. Eyes: Conjunctivae are normal.  Head: Atraumatic. Nose: No congestion/rhinnorhea. Mouth/Throat: Mucous membranes are moist. Neck: No stridor.  No meningeal signs.   Cardiovascular: Normal rate, regular rhythm. Good peripheral circulation. Grossly normal heart sounds. Respiratory: Normal respiratory effort.  No retractions. Lungs CTAB. Gastrointestinal: Soft and nondistended.  Tenderness to palpation primarily of the epigastrium and right upper quadrant with positive Murphy sign.  Mild tenderness to palpation of the lower abdomen with no rebound and no guarding, no exquisite tenderness at McBurney's point. Genitourinary: Normal external exam.  Moderate amount of yellowish-white vaginal discharge in the vaginal vault and surrounding the cervix.  There is a small area of whitish crusty material on the cervix at approximately 10-11 o'clock.  The cervix and the lesion on it is nonfriable and there is no visible bleeding.  No cervical motion tenderness on bimanual exam.  ED chaperone present throughout exam. Musculoskeletal: No lower extremity tenderness nor edema. No gross deformities of extremities. Neurologic:  Normal speech and language. No gross focal neurologic deficits are appreciated.  Skin:  Skin is warm, dry and intact. No rash noted. Psychiatric: Mood and affect are normal. Speech and behavior are normal.  ____________________________________________   LABS (all labs ordered are listed, but only abnormal results are displayed)  Labs Reviewed  WET PREP, GENITAL - Abnormal; Notable for the following components:      Result Value   Clue Cells Wet Prep HPF POC PRESENT (*)    WBC, Wet Prep HPF POC MANY (*)    All other components within normal limits  COMPREHENSIVE METABOLIC PANEL - Abnormal; Notable for the following  components:   Glucose, Bld 113 (*)    All other components within normal limits  URINALYSIS, ROUTINE W REFLEX MICROSCOPIC - Abnormal; Notable for the following components:   Color, Urine YELLOW (*)    APPearance CLEAR (*)    Hgb urine dipstick MODERATE (*)    All other components within normal limits  CHLAMYDIA/NGC RT PCR ([facility] ONLY)  CBC WITH DIFFERENTIAL/PLATELET  LIPASE, BLOOD  RPR  RAPID HIV SCREEN (HIV 1/2 AB+AG)  POC URINE PREG, ED  POCT PREGNANCY, URINE   ____________________________________________  EKG  None - EKG not ordered by ED physician ____________________________________________  RADIOLOGY   ED MD interpretation: Cholelithiasis without evidence of cholecystitis.  Official radiology report(s): US Abdomen Limited Ruq  Result Date: 07/01/2018 CLINICAL DATA:  Generalized abdominal pain with nausea, worse after eating. EXAM: ULTRASOUND ABDOMEN LIMITED RIGHT UPPER QUADRANT COMPARISON:  None. FINDINGS: Gallbladder: Numerous gallstones measuring up to 2.1 cm. No gallbladder wall thickening or pericholecystic fluid. Negative sonographic Murphy's sign. Common bile duct: Diameter: 3.3 mm. Liver: No focal lesion  identified. Diffusely increased liver echogenicity. Portal vein is patent on color Doppler imaging with normal direction of blood flow towards the liver. IMPRESSION: 1. Cholelithiasis.  No secondary signs of acute cholecystitis. 2. Hepatic steatosis. Electronically Signed   By: Mitzi Hansen M.D.   On: 07/01/2018 06:13    ____________________________________________   PROCEDURES  Critical Care performed: No   Procedure(s) performed:   Procedures   ____________________________________________   INITIAL IMPRESSION / ASSESSMENT AND PLAN / ED COURSE  As part of my medical decision making, I reviewed the following data within the electronic MEDICAL RECORD NUMBER Nursing notes reviewed and incorporated, Labs reviewed , Old chart reviewed,  Patient signed out to Dr. Shaune Pollack, reviewed Notes from prior ED visits and [place] Controlled Substance Database    Differential diagnosis includes, but is not limited to, biliary disease, PID/STD, UTI, kidney stones, appendicitis, ovarian cyst or torsion.  Given the description of the patient's symptoms, episodic that is considerably worse after eating but persisted for a while after she finishes eating, I strongly suspect gallstones.  The her physical exam is notable for the most tenderness being in the epigastrium and right upper quadrant with a positive Murphy sign although she does report some mild tenderness to palpation of the lower abdomen as well with no rebound or guarding.  We agreed to check a right upper quadrant ultrasound first.  Her labs are reassuring with a normal CMP and lipase and BMP including no leukocytosis.  Urinalysis is pending.  I will discuss a pelvic exam with her after she gets the ultrasound.  Clinical Course as of Jul 02 727  Thu Jul 01, 2018  0981 I have updated the patient about her results that indicate cholelithiasis without evidence of cholecystitis and I had my usual customary discussion about what this means.  She understands that her labs are normal and that she needs to follow-up with surgeon.  I pointed out that I cannot guarantee that she does not have a pelvic infection, however, and asked her if she wants a pelvic exam for STD testing and she indicated that she does.  She is in no distress at this time and we will perform pelvic exam and send swabs that will need to be followed up to determine if she needs treatment.  Anticipate she will be appropriate for discharge and outpatient follow-up.  At this point, given her level of comfort and no pain with ambulation, I diagnosis the make sense of cholelithiasis, etc., she does not need additional imaging such as a CT scan at this time and nothing about her symptoms suggest ovarian torsion.   [CF]  0720 I reviewed the  [place] controlled substance database and the patient has had 2 prior prescriptions for tramadol but she is at low risk for abuse.  I am prescribing her a small number of Norco to help until she follows up as an outpatient.   [CF]  0720 At the patient's ultrasound is positive for cholelithiasis but without evidence of cholecystitis.  Review of her lab work demonstrates no abnormal labs except for some red blood cells and hemoglobin in her urine.  She reports that her last menstrual cycle was about a month ago so this may represent some contaminant but she also is not having signs and symptoms that are consistent with nephrolithiasis and I do not think that a young, 22 year old woman, with normal labs and pain worse after she eats would benefit from a CT scan at this time.GC/Chlamydia are pending.  To complete the STD work-up I have also requested an RPR and HIV to be sent.  Her pelvic exam was notable for significant yellowish-white vaginal secretions and a small area on her cervix that appeared whitish and abnormal, but she had no cervical motion tenderness.  I think she would benefit from a waiting the GC chlamydia results to know if she would need treatment.  Her wet prep indicates clue cells and I have written her prescription for metronidazole for treatment of bacterial vaginosis.   [CF]  [id number] Transferring ED care to Dr. Shaune PollackLord to follow up on GC/Chlamydia, RPR, and HIV results and treat/dispo appropriately.  Anticipate d/c and outpatient follow up.   [CF]    Clinical Course User Index [CF] Loleta RoseForbach, Jmya Uliano, MD    ____________________________________________  FINAL CLINICAL IMPRESSION(S) / ED DIAGNOSES  Final diagnoses:  Abdominal pain, unspecified abdominal location  Biliary calculus of other site without obstruction  Bacterial vaginosis     MEDICATIONS GIVEN DURING THIS VISIT:  Medications  morphine 4 MG/ML injection 4 mg (4 mg Intravenous Given 07/01/18 0605)  ondansetron (ZOFRAN)  injection 4 mg (4 mg Intravenous Given 07/01/18 0605)     ED Discharge Orders    None       Note:  This document was prepared using Dragon voice recognition software and may include unintentional dictation errors.    Loleta RoseForbach, Garyn Waguespack, MD 07/01/18 [phone]

## 2018-07-01 NOTE — ED Provider Notes (Signed)
[facility]  I accepted care from Dr. York CeriseForbach ____________________________________________    LABS (pertinent positives/negatives)  I, Governor Rooksebecca Enisa Runyan, MD have personally reviewed the lab reports noted below.  Labs Reviewed  WET PREP, GENITAL - Abnormal; Notable for the following components:      Result Value   Clue Cells Wet Prep HPF POC PRESENT (*)    WBC, Wet Prep HPF POC MANY (*)    All other components within normal limits  CHLAMYDIA/NGC RT PCR ([facility] ONLY) - Abnormal; Notable for the following components:   Chlamydia Tr DETECTED (*)    All other components within normal limits  COMPREHENSIVE METABOLIC PANEL - Abnormal; Notable for the following components:   Glucose, Bld 113 (*)    All other components within normal limits  URINALYSIS, ROUTINE W REFLEX MICROSCOPIC - Abnormal; Notable for the following components:   Color, Urine YELLOW (*)    APPearance CLEAR (*)    Hgb urine dipstick MODERATE (*)    All other components within normal limits  CBC WITH DIFFERENTIAL/PLATELET  LIPASE, BLOOD  RAPID HIV SCREEN (HIV 1/2 AB+AG)  RPR  POC URINE PREG, ED  POCT PREGNANCY, URINE      ____________________________________________   PROCEDURES  Procedure(s) performed: None  Procedures  Critical Care performed: None  ____________________________________________   INITIAL IMPRESSION / ASSESSMENT AND PLAN / ED COURSE   Pertinent labs & imaging results that were available during my care of the patient were reviewed by me and considered in my medical decision making (see chart for details).   Awaiting gonorrhea chlamydia and HIV results.  I reviewed the chlamydia positive.  Based on Dr. Scharlene CornForbach's discussion, we will go ahead and treat for PID with dose of Rocephin 250 mg as well as 14 days of doxycycline.  For the Bactrim vaginosis seen on wet prep, patient will also be given perception for Flagyl.  I discussed her results with her including  the negative HIV test.  RPR for syphilis test is not back yet.  Discussed that should be called by nurse if testing is positive.    CONSULTATIONS: None    Patient / Family / Caregiver informed of clinical course, medical decision-making process, and agree with plan.   I discussed return precautions, follow-up instructions, and discharged instructions with patient and/or family.   ____________________________________________   FINAL CLINICAL IMPRESSION(S) / ED DIAGNOSES  Final diagnoses:  Abdominal pain, unspecified abdominal location  Biliary calculus of other site without obstruction  Bacterial vaginosis  Chlamydia  PID (acute pelvic inflammatory disease)        Governor RooksLord, Koda Defrank, MD 07/01/18 [phone]

## 2018-07-01 NOTE — ED Triage Notes (Signed)
Patient ambulatory to triage with steady gait, without difficulty or distress noted; pt reports right lower abd pain accomp by nausea last few days

## 2018-07-01 NOTE — ED Notes (Signed)
Pt to US.

## 2018-07-02 LAB — RPR: RPR Ser Ql: NONREACTIVE

## 2018-08-02 ENCOUNTER — Encounter: Payer: Self-pay | Admitting: Emergency Medicine

## 2018-08-02 ENCOUNTER — Emergency Department: Payer: BLUE CROSS/BLUE SHIELD

## 2018-08-02 ENCOUNTER — Other Ambulatory Visit: Payer: Self-pay

## 2018-08-02 ENCOUNTER — Emergency Department
Admission: EM | Admit: 2018-08-02 | Discharge: 2018-08-02 | Disposition: A | Discharge status: Home / Self Care | Payer: BLUE CROSS/BLUE SHIELD | Attending: Emergency Medicine | Admitting: Emergency Medicine

## 2018-08-02 DIAGNOSIS — R112 Nausea with vomiting, unspecified: Secondary | ICD-10-CM | POA: Diagnosis not present

## 2018-08-02 DIAGNOSIS — Z79899 Other long term (current) drug therapy: Secondary | ICD-10-CM | POA: Insufficient documentation

## 2018-08-02 DIAGNOSIS — J209 Acute bronchitis, unspecified: Secondary | ICD-10-CM | POA: Diagnosis not present

## 2018-08-02 DIAGNOSIS — R197 Diarrhea, unspecified: Secondary | ICD-10-CM | POA: Diagnosis not present

## 2018-08-02 DIAGNOSIS — R05 Cough: Secondary | ICD-10-CM | POA: Diagnosis present

## 2018-08-02 DIAGNOSIS — R0981 Nasal congestion: Secondary | ICD-10-CM | POA: Insufficient documentation

## 2018-08-02 LAB — INFLUENZA PANEL BY PCR (TYPE A & B)
Influenza A By PCR: NEGATIVE
Influenza B By PCR: NEGATIVE

## 2018-08-02 MED ORDER — AZITHROMYCIN 250 MG PO TABS: ORAL_TABLET | ORAL | 0 refills | Status: DC

## 2018-08-02 MED ORDER — ALBUTEROL SULFATE HFA 108 (90 BASE) MCG/ACT IN AERS: 2.0000 | INHALATION_SPRAY | Freq: Four times a day (QID) | RESPIRATORY_TRACT | 2 refills | Status: DC | PRN

## 2018-08-02 MED ORDER — PREDNISONE 10 MG (21) PO TBPK: ORAL_TABLET | ORAL | 0 refills | Status: DC

## 2018-08-02 MED ORDER — IPRATROPIUM-ALBUTEROL 0.5-2.5 (3) MG/3ML IN SOLN
3.0000 mL | Freq: Once | RESPIRATORY_TRACT | Status: AC
  Administered 2018-08-02: 3 mL via RESPIRATORY_TRACT
  Filled 2018-08-02: qty 3

## 2018-08-02 MED ORDER — BENZONATATE 200 MG PO CAPS: 200.0000 mg | ORAL_CAPSULE | Freq: Three times a day (TID) | ORAL | 0 refills | Status: DC | PRN

## 2018-08-02 NOTE — ED Notes (Signed)
Pt leaving for CXR.  

## 2018-08-02 NOTE — ED Triage Notes (Signed)
Pt with N/V/D and body aches since sat.

## 2018-08-02 NOTE — ED Notes (Signed)
Pt c/o n/v, body aches, HA, diarrhea, chills/hot flashes. Unable to keep anything down.

## 2018-08-02 NOTE — ED Provider Notes (Signed)
[facility] Emergency Department Provider Note  ____________________________________________   None    (approximate)  I have reviewed the triage vital signs and the nursing notes.   HISTORY  Chief Complaint Emesis and Diarrhea    HPI Dawn Richards is a 22 y.o. female presents emergency department complaining of cough and congestion with wheezing, questionable fever but unsure.  Also has had nausea and vomiting and diarrhea..  Symptoms for 2 days.  Denie chest pain or shortness of breath.   Past Medical History:  Diagnosis Date  . DUB (dysfunctional uterine bleeding)   . Pelvic pain     Patient Active Problem List   Diagnosis Date Noted  . 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 NSAID overdose, intentional self-harm, sequela (HCC) 05/08/2017  . 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 Dysmenorrhea 08/13/2016  . Abnormal uterine bleeding (AUB) 08/13/2016    Past Surgical History:  Procedure Laterality Date  . NO PAST SURGERIES      Prior to Admission medications   Medication Sig Start Date End Date Taking? Authorizing Provider  albuterol (PROVENTIL HFA;VENTOLIN HFA) 108 (90 Base) MCG/ACT inhaler Inhale 2 puffs into the lungs every 6 (six) hours as needed for wheezing or shortness of breath. 08/02/18   Sherrie MustacheFisher, Roselyn BeringSusan W, PA-C  azithromycin (ZITHROMAX Z-PAK) 250 MG tablet 2 pills today then 1 pill a day for 4 days 08/02/18   Sherrie MustacheFisher, Roselyn BeringSusan W, PA-C  benzonatate (TESSALON) 200 MG capsule Take 1 capsule (200 mg total) by mouth 3 (three) times daily as needed for cough. 08/02/18   Tyreik Delahoussaye, Roselyn BeringSusan W, PA-C  Norethindrone-Ethinyl Estradiol-Fe Biphas (LO LOESTRIN FE) 1 MG-10 MCG / 10 MCG tablet Take 1 tablet by mouth daily. 08/13/16   Defrancesco, Prentice DockerMartin A, MD  predniSONE (STERAPRED UNI-PAK 21 TAB) 10 MG (21) TBPK tablet Take 6 pills on day one then decrease by 1 pill each day 08/02/18   Faythe GheeFisher, Elzina Devera W, PA-C     Allergies Patient has no known allergies.  Family History  Problem Relation Age of Onset  . Breast cancer Neg Hx   . Ovarian cancer Neg Hx   . Colon cancer Neg Hx   . Diabetes Neg Hx   . Heart disease Neg Hx     Social History Social History   Tobacco Use  . Smoking status: Never Smoker  . Smokeless tobacco: Never Used  Substance Use Topics  . Alcohol use: Yes    Alcohol/week: 0.0 standard drinks    Comment: occas  . Drug use: No    Review of Systems  Constitutional: No fever/chills Eyes: No visual changes. ENT: No sore throat. Respiratory: Positive cough and congestion, positive wheezing Gastrointestinal: Positive for nausea/vomiting and diarrhea Genitourinary: Negative for dysuria. Musculoskeletal: Negative for back pain. Skin: Negative for rash.    ____________________________________________   PHYSICAL EXAM:  VITAL SIGNS: ED Triage Vitals  Enc Vitals Group     BP 08/02/18 1411 140/85     Pulse Rate 08/02/18 1411 (!) 101     Resp --      Temp 08/02/18 1411 99.5 F (37.5 C)     Temp Source 08/02/18 1411 Oral     SpO2 08/02/18 1410 96 %     Weight 08/02/18 1412 197 lb 5 oz (89.5 kg)     Height 08/02/18 1412 5' (1.524 m)     Head Circumference --      Peak Flow --      Pain  Score 08/02/18 1412 10     Pain Loc --      Pain Edu? --      Excl. in GC? --     Constitutional: Alert and oriented. Well appearing and in no acute distress. Eyes: Conjunctivae are normal.  Head: Atraumatic. ENT: TMS are bilaterally Nose: No congestion/rhinnorhea. Mouth/Throat: Mucous membranes are moist.   NECK: Is supple, no lymphadenopathy is noted  cardiovascular: Normal rate, regular rhythm.  Heart sounds are normal Respiratory: Normal respiratory effort.  No retractions, lungs with wheezing in both lung fields Abdomen: Soft nontender bowel sounds normal all 4 quadrants GU: deferred Musculoskeletal: FROM all extremities, warm and well perfused Neurologic:   Normal speech and language.  Skin:  Skin is warm, dry and intact. No rash noted. Psychiatric: Mood and affect are normal. Speech and behavior are normal.  ____________________________________________   LABS (all labs ordered are listed, but only abnormal results are displayed)  Labs Reviewed  INFLUENZA PANEL BY PCR (TYPE A & B)   ____________________________________________   ____________________________________________  RADIOLOGY  Chest x-ray is negative  ____________________________________________   PROCEDURES  Procedure(s) performed: DuoNeb   Procedures    ____________________________________________   INITIAL IMPRESSION / ASSESSMENT AND PLAN / ED COURSE  Pertinent labs & imaging results that were available during my care of the patient were reviewed by me and considered in my medical decision making (see chart for details).   Patient is 22 year old female presents emergency department flulike symptoms.  Physical exam patient has low-grade temp, pulse is slightly elevated at 101, lungs with diffuse wheezing, abdomen soft and nontender.  Neuro exam is unremarkable  Flu swab negative  Chest x-ray is negative for pneumonia  On reexamination of the patient after her DuoNeb treatment the lungs have improved air movement  Explained all of the findings to the patient.  She is given a Z-Pak, Tessalon Perles, Sterapred, and albuterol inhaler.  She is to follow-up with regular doctor if not better in 3 days.  Return if worsening.  She states she understands will comply.  She is discharged stable condition.     As part of my medical decision making, I reviewed the following data within the electronic MEDICAL RECORD NUMBER Nursing notes reviewed and incorporated, Labs reviewed flu swab negative, Old chart reviewed, Radiograph reviewed chest x-ray negative for pneumonia, Notes from prior ED visits and [place] Controlled Substance  Database  ____________________________________________   FINAL CLINICAL IMPRESSION(S) / ED DIAGNOSES  Final diagnoses:  Acute bronchitis, unspecified organism      NEW MEDICATIONS STARTED DURING THIS VISIT:  New Prescriptions   ALBUTEROL (PROVENTIL HFA;VENTOLIN HFA) 108 (90 BASE) MCG/ACT INHALER    Inhale 2 puffs into the lungs every 6 (six) hours as needed for wheezing or shortness of breath.   AZITHROMYCIN (ZITHROMAX Z-PAK) 250 MG TABLET    2 pills today then 1 pill a day for 4 days   BENZONATATE (TESSALON) 200 MG CAPSULE    Take 1 capsule (200 mg total) by mouth 3 (three) times daily as needed for cough.   PREDNISONE (STERAPRED UNI-PAK 21 TAB) 10 MG (21) TBPK TABLET    Take 6 pills on day one then decrease by 1 pill each day     Note:  This document was prepared using Dragon voice recognition software and may include unintentional dictation errors.     Faythe GheeFisher, Jonasia Coiner W, PA-C 08/02/18 1645    Dionne BucySiadecki, Sebastian, MD 08/02/18 [phone]

## 2018-08-02 NOTE — Discharge Instructions (Addendum)
Follow-up with your regular doctor or the acute care if not better in 3 days.  Take medication as prescribed.  Return if worsening.

## 2019-06-08 ENCOUNTER — Ambulatory Visit (LOCAL_COMMUNITY_HEALTH_CENTER): Payer: Self-pay

## 2019-06-08 ENCOUNTER — Other Ambulatory Visit: Payer: Self-pay

## 2019-06-08 VITALS — BP 128/70 | Ht 61.0 in | Wt 200.0 lb

## 2019-06-08 DIAGNOSIS — Z3201 Encounter for pregnancy test, result positive: Secondary | ICD-10-CM

## 2019-06-08 LAB — PREGNANCY, URINE: Preg Test, Ur: POSITIVE — AB

## 2019-06-08 MED ORDER — PRENATAL VITAMINS 28-0.8 MG PO TABS: 28.0000 mg | ORAL_TABLET | Freq: Every day | ORAL | 0 refills | Status: AC

## 2019-06-08 NOTE — Progress Notes (Signed)
Patient accepts PNV. States she will make an appt with [facility] for [facility]. Aileen Fass, RN

## 2019-06-22 ENCOUNTER — Other Ambulatory Visit (HOSPITAL_COMMUNITY)
Admission: RE | Admit: 2019-06-22 | Discharge: 2019-06-22 | Disposition: A | Discharge status: Home / Self Care | Payer: Medicaid Other | Source: Ambulatory Visit | Attending: Advanced Practice Midwife | Admitting: Advanced Practice Midwife

## 2019-06-22 ENCOUNTER — Encounter: Payer: Self-pay | Admitting: Advanced Practice Midwife

## 2019-06-22 ENCOUNTER — Ambulatory Visit (INDEPENDENT_AMBULATORY_CARE_PROVIDER_SITE_OTHER): Payer: Self-pay | Admitting: Advanced Practice Midwife

## 2019-06-22 ENCOUNTER — Other Ambulatory Visit: Payer: Self-pay

## 2019-06-22 VITALS — BP 118/78 | Wt 191.0 lb

## 2019-06-22 DIAGNOSIS — Z3A09 9 weeks gestation of pregnancy: Secondary | ICD-10-CM

## 2019-06-22 DIAGNOSIS — Z3401 Encounter for supervision of normal first pregnancy, first trimester: Secondary | ICD-10-CM | POA: Diagnosis present

## 2019-06-22 DIAGNOSIS — O0990 Supervision of high risk pregnancy, unspecified, unspecified trimester: Secondary | ICD-10-CM | POA: Insufficient documentation

## 2019-06-22 DIAGNOSIS — Z113 Encounter for screening for infections with a predominantly sexual mode of transmission: Secondary | ICD-10-CM

## 2019-06-22 DIAGNOSIS — Z23 Encounter for immunization: Secondary | ICD-10-CM

## 2019-06-22 DIAGNOSIS — Z124 Encounter for screening for malignant neoplasm of cervix: Secondary | ICD-10-CM

## 2019-06-22 DIAGNOSIS — O99211 Obesity complicating pregnancy, first trimester: Secondary | ICD-10-CM

## 2019-06-22 DIAGNOSIS — Z3400 Encounter for supervision of normal first pregnancy, unspecified trimester: Secondary | ICD-10-CM

## 2019-06-22 DIAGNOSIS — O99210 Obesity complicating pregnancy, unspecified trimester: Secondary | ICD-10-CM

## 2019-06-22 DIAGNOSIS — Z6836 Body mass index (BMI) 36.0-36.9, adult: Secondary | ICD-10-CM

## 2019-06-22 NOTE — Patient Instructions (Signed)
Perinatal Anxiety °When a woman feels excessive tension or worry (anxiety) during pregnancy or during the first 12 months after she gives birth, she has a condition called perinatal anxiety. Anxiety can interfere with work, school, relationships, and other everyday activities. If it is not managed properly, it can also cause problems in the mother and her baby.  °If you are pregnant and you have symptoms of an anxiety disorder, it is important to talk with your health care provider. °What are the causes? °The exact cause of this condition is not known. Hormonal changes during and after pregnancy may play a role in causing perinatal anxiety. °What increases the risk? °You are more likely to develop this condition if: °· You have a personal or family history of depression, anxiety, or mood disorders. °· You experience a stressful life event during pregnancy, such as the death of a loved one. °· You have a lot of regular life stress, such as being a single parent. °· You have thyroid problems. °What are the signs or symptoms? °Perinatal anxiety can be different for everyone. It may include: °· Panic attacks (panic disorder). These are intense episodes of fear or discomfort that may also cause sweating, nausea, shortness of breath, or fear of dying. They usually last 5-15 minutes. °· Reliving an upsetting (traumatic) event through distressing thoughts, dreams, or flashbacks (post-traumatic stress disorder, or PTSD). °· Excessive worry about multiple problems (generalized anxiety disorder). °· Fear and stress about leaving certain people or loved ones (separation anxiety). °· Performing repetitive tasks (compulsions) to relieve stress or worry (obsessive compulsive disorder, or OCD). °· Fear of certain objects or situations (phobias). °· Excessive worrying, such as a constant feeling that something bad is going to happen. °· Inability to relax. °· Difficulty concentrating. °· Sleep problems. °· Frequent nightmares or  disturbing thoughts. °How is this diagnosed? °This condition is diagnosed based on a physical exam and mental evaluation. In some cases, your health care provider may use an anxiety screening tool. These tools include a list of questions that can help a health care provider diagnose anxiety. Your health care provider may refer you to a mental health expert who specializes in anxiety. °How is this treated? °This condition may be treated with: °· Medicines. Your health care provider will only give you medicines that have been proven safe for pregnancy and breastfeeding. °· Talk therapy with a mental health professional to help change your patterns of thinking (cognitive behavioral therapy). °· Mindfulness-based stress reduction. °· Other relaxation therapies, such as deep breathing or guided muscle relaxation. °· Support groups. °Follow these instructions at home: °Lifestyle °· Do not use any products that contain nicotine or tobacco, such as cigarettes and e-cigarettes. If you need help quitting, ask your health care provider. °· Do not use alcohol when you are pregnant. After your baby is born, limit alcohol intake to no more than 1 drink a day. One drink equals 12 oz of beer, 5 oz of wine, or 1½ oz of hard liquor. °· Consider joining a support group for new mothers. Ask your health care provider for recommendations. °· Take good care of yourself. Make sure you: °? Get plenty of sleep. If you are having trouble sleeping, talk with your health care provider. °? Eat a healthy diet. This includes plenty of fruits and vegetables, whole grains, and lean proteins. °? Exercise regularly, as told by your health care provider. Ask your health care provider what exercises are safe for you. °General instructions °· Take over-the-counter   and prescription medicines only as told by your health care provider. °· Talk with your partner or family members about your feelings during pregnancy. Share any concerns or fears that you may  have. °· Ask for help with tasks or chores when you need it. Ask friends and family members to provide meals, watch your children, or help with cleaning. °· Keep all follow-up visits as told by your health care provider. This is important. °Contact a health care provider if: °· You (or people close to you) notice that you have any symptoms of anxiety or depression. °· You have anxiety and your symptoms get worse. °· You experience side effects from medicines, such as nausea or sleep problems. °Get help right away if: °· You feel like hurting yourself, your baby, or someone else. °If you ever feel like you may hurt yourself or others, or have thoughts about taking your own life, get help right away. You can go to your nearest emergency department or call: °· Your local emergency services (911 in the [place] °· A suicide crisis helpline, such as the National Suicide Prevention Lifeline at 1-800-273-8255. This is open 24 hours a day. °Summary °· Perinatal anxiety is when a woman feels excessive tension or worry during pregnancy or during the first 12 months after she gives birth. °· Perinatal anxiety may include panic attacks, post-traumatic stress disorder, separation anxiety, phobias, or generalized anxiety. °· Perinatal anxiety can cause physical health problems in the mother and baby if not properly managed. °· This condition is treated with medicines, talk therapy, stress reduction therapies, or a combination of two or more treatments. °· Talk with your partner or family members about your concerns or fears. Do not be afraid to ask for help. °This information is not intended to replace advice given to you by your health care provider. Make sure you discuss any questions you have with your health care provider. °Document Released: 09/24/2016 Document Revised: 07/31/2017 Document Reviewed: 09/24/2016 °Elsevier Patient Education © 2020 [employer] °Exercise During Pregnancy °Exercise is an important part of being  healthy for people of all ages. Exercise improves the function of your heart and lungs and helps you maintain strength, flexibility, and a healthy body weight. Exercise also boosts energy levels and elevates mood. °Most women should exercise regularly during pregnancy. In rare cases, women with certain medical conditions or complications may be asked to limit or avoid exercise during pregnancy. °How does this affect me? °Along with maintaining general strength and flexibility, exercising during pregnancy can help: °· Keep strength in muscles that are used during labor and childbirth. °· Decrease low back pain. °· Reduce symptoms of depression. °· Control weight gain during pregnancy. °· Reduce the risk of needing insulin if you develop diabetes during pregnancy. °· Decrease the risk of cesarean delivery. °· Speed up your recovery after giving birth. °How does this affect my baby? °Exercise can help you have a healthy pregnancy. Exercise does not cause premature birth. It will not cause your baby to weigh less at birth. °What exercises can I do? °Many exercises are safe for you to do during pregnancy. Do a variety of exercises that safely increase your heart and breathing rates and help you build and maintain muscle strength. Do exercises exactly as told by your health care provider. You may do these exercises: °· Walking or hiking. °· Swimming. °· Water aerobics. °· Riding a stationary bike. °· Strength training. °· Modified yoga or Pilates. Tell your instructor that you are pregnant.   Avoid overstretching, and avoid lying on your back for long periods of time. °· Running or jogging. Only choose this type of exercise if you: °? Ran or jogged regularly before your pregnancy. °? Can run or jog and still talk in complete sentences. °What exercises should I avoid? °Depending on your level of fitness and whether you exercised regularly before your pregnancy, you may be told to limit high-intensity exercise. You can tell  that you are exercising at a high intensity if you are breathing much harder and faster and cannot hold a conversation while exercising. You must avoid: °· Contact sports. °· Activities that put you at risk for falling on or being hit in the belly, such as downhill skiing, water skiing, surfing, rock climbing, cycling, gymnastics, and horseback riding. °· Scuba diving. °· Skydiving. °· Yoga or Pilates in a room that is heated to high temperatures. °· Jogging or running, unless you ran or jogged regularly before your pregnancy. While jogging or running, you should always be able to talk in full sentences. Do not run or jog so fast that you are unable to have a conversation. °· Do not exercise at more than 6,000 feet above sea level (high elevation) if you are not used to exercising at high elevation. °How do I exercise in a safe way? ° °· Avoid overheating. Do not exercise in very high temperatures. °· Wear loose-fitting, breathable clothes. °· Avoid dehydration. Drink enough water before, during, and after exercise to keep your urine pale yellow. °· Avoid overstretching. Because of hormone changes during pregnancy, it is easy to overstretch muscles, tendons, and ligaments during pregnancy. °· Start slowly and ask your health care provider to recommend the types of exercise that are safe for you. °· Do not exercise to lose weight. °Follow these instructions at home: °· Exercise on most days or all days of the week. Try to exercise for 30 minutes a day, 5 days a week, unless your health care provider tells you not to. °· If you actively exercised before your pregnancy and you are healthy, your health care provider may tell you to continue to do moderate to high-intensity exercise. °· If you are just starting to exercise or did not exercise much before your pregnancy, your health care provider may tell you to do low to moderate-intensity exercise. °Questions to ask your health care provider °· Is exercise safe for  me? °· What are signs that I should stop exercising? °· Does my health condition mean that I should not exercise during pregnancy? °· When should I avoid exercising during pregnancy? °Stop exercising and contact a health care provider if: °You have any unusual symptoms, such as: °· Mild contractions of the uterus or cramps in the abdomen. °· Dizziness that does not go away when you rest. °Stop exercising and get help right away if: °You have any unusual symptoms, such as: °· Sudden, severe pain in your low back or your belly. °· Mild contractions of the uterus or cramps in the abdomen that do not improve with rest and drinking fluids. °· Chest pain. °· Bleeding or fluid leaking from your vagina. °· Shortness of breath. °These symptoms may represent a serious problem that is an emergency. Do not wait to see if the symptoms will go away. Get medical help right away. Call your local emergency services (911 in the [place] Do not drive yourself to the hospital. °Summary °· Most women should exercise regularly throughout pregnancy. In rare cases, women with certain medical   conditions or complications may be asked to limit or avoid exercise during pregnancy. °· Do not exercise to lose weight during pregnancy. °· Your health care provider will tell you what level of physical activity is right for you. °· Stop exercising and contact a health care provider if you have mild contractions of the uterus or cramps in the abdomen. Get help right away if these contractions or cramps do not improve with rest and drinking fluids. °· Stop exercising and get help right away if you have sudden, severe pain in your low back or belly, chest pain, shortness of breath, or bleeding or leaking of fluid from your vagina. °This information is not intended to replace advice given to you by your health care provider. Make sure you discuss any questions you have with your health care provider. °Document Released: 07/28/2005 Document Revised:  11/18/2018 Document Reviewed: 09/01/2018 °Elsevier Patient Education © 2020 [employer] °Eating Plan for Pregnant Women °While you are pregnant, your body requires additional nutrition to help support your growing baby. You also have a higher need for some vitamins and minerals, such as folic acid, calcium, iron, and vitamin D. Eating a healthy, well-balanced diet is very important for your health and your baby's health. Your need for extra calories varies for the three 3-month segments of your pregnancy (trimesters). For most women, it is recommended to consume: °· 150 extra calories a day during the first trimester. °· 300 extra calories a day during the second trimester. °· 300 extra calories a day during the third trimester. °What are tips for following this plan? ° °· Do not try to lose weight or go on a diet during pregnancy. °· Limit your overall intake of foods that have "empty calories." These are foods that have little nutritional value, such as sweets, desserts, candies, and sugar-sweetened beverages. °· Eat a variety of foods (especially fruits and vegetables) to get a full range of vitamins and minerals. °· Take a prenatal vitamin to help meet your additional vitamin and mineral needs during pregnancy, specifically for folic acid, iron, calcium, and vitamin D. °· Remember to stay active. Ask your health care provider what types of exercise and activities are safe for you. °· Practice good food safety and cleanliness. Wash your hands before you eat and after you prepare raw meat. Wash all fruits and vegetables well before peeling or eating. Taking these actions can help to prevent food-borne illnesses that can be very dangerous to your baby, such as listeriosis. Ask your health care provider for more information about listeriosis. °What does 150 extra calories look like? °Healthy options that provide 150 extra calories each day could be any of the following: °· 6-8 oz (170-230 g) of plain low-fat  yogurt with ½ cup of berries. °· 1 apple with 2 teaspoons (11 g) of peanut butter. °· Cut-up vegetables with ¼ cup (60 g) of hummus. °· 8 oz (230 mL) or 1 cup of low-fat chocolate milk. °· 1 stick of string cheese with 1 medium orange. °· 1 peanut butter and jelly sandwich that is made with one slice of whole-wheat bread and 1 tsp (5 g) of peanut butter. °For 300 extra calories, you could eat two of those healthy options each day. °What is a healthy amount of weight to gain? °The right amount of weight gain for you is based on your BMI before you became pregnant. If your BMI: °· Was less than 18 (underweight), you should gain 28-40 lb (13-18 kg). °· Was   18-24.9 (normal), you should gain 25-35 lb (11-16 kg). °· Was 25-29.9 (overweight), you should gain 15-25 lb (7-11 kg). °· Was 30 or greater (obese), you should gain 11-20 lb (5-9 kg). °What if I am having twins or multiples? °Generally, if you are carrying twins or multiples: °· You may need to eat 300-600 extra calories a day. °· The recommended range for total weight gain is 25-54 lb (11-25 kg), depending on your BMI before pregnancy. °· Talk with your health care provider to find out about nutritional needs, weight gain, and exercise that is right for you. °What foods can I eat? ° °Grains °All grains. Choose whole grains, such as whole-wheat bread, oatmeal, or brown rice. °Vegetables °All vegetables. Eat a variety of colors and types of vegetables. Remember to wash your vegetables well before peeling or eating. °Fruits °All fruits. Eat a variety of colors and types of fruit. Remember to wash your fruits well before peeling or eating. °Meats and other protein foods °Lean meats, including chicken, turkey, fish, and lean cuts of beef, veal, or pork. If you eat fish or seafood, choose options that are higher in omega-3 fatty acids and lower in mercury, such as salmon, herring, mussels, trout, sardines, pollock, shrimp, crab, and lobster. Tofu. Tempeh. Beans. Eggs.  Peanut butter and other nut butters. Make sure that all meats, poultry, and eggs are cooked to food-safe temperatures or "well-done." °Two or more servings of fish are recommended each week in order to get the most benefits from omega-3 fatty acids that are found in seafood. Choose fish that are lower in mercury. You can find more information online: °· www.fda.gov °Dairy °Pasteurized milk and milk alternatives (such as almond milk). Pasteurized yogurt and pasteurized cheese. Cottage cheese. Sour cream. °Beverages °Water. Juices that contain 100% fruit juice or vegetable juice. Caffeine-free teas and decaffeinated coffee. °Drinks that contain caffeine are okay to drink, but it is better to avoid caffeine. Keep your total caffeine intake to less than 200 mg each day (which is 12 oz or 355 mL of coffee, tea, or soda) or the limit as told by your health care provider. °Fats and oils °Fats and oils are okay to include in moderation. °Sweets and desserts °Sweets and desserts are okay to include in moderation. °Seasoning and other foods °All pasteurized condiments. °The items listed above may not be a complete list of recommended foods and beverages. Contact your dietitian for more options. °The items listed above may not be a complete list of foods and beverages [you/your child] can eat. Contact a dietitian for more information. °What foods are not recommended? °Vegetables °Raw (unpasteurized) vegetable juices. °Fruits °Unpasteurized fruit juices. °Meats and other protein foods °Lunch meats, bologna, hot dogs, or other deli meats. (If you must eat those meats, reheat them until they are steaming hot.) Refrigerated paté, meat spreads from a meat counter, smoked seafood that is found in the refrigerated section of a store. Raw or undercooked meats, poultry, and eggs. Raw fish, such as sushi or sashimi. Fish that have high mercury content, such as tilefish, shark, swordfish, and king mackerel. °To learn more about mercury  in fish, talk with your health care provider or look for online resources, such as: °· www.fda.gov °Dairy °Raw (unpasteurized) milk and any foods that have raw milk in them. Soft cheeses, such as feta, queso blanco, queso fresco, Brie, Camembert cheeses, blue-veined cheeses, and Panela cheese (unless it is made with pasteurized milk, which must be stated on the label). °Beverages °Alcohol.   Sugar-sweetened beverages, such as sodas, teas, or energy drinks. °Seasoning and other foods °Homemade fermented foods and drinks, such as pickles, sauerkraut, or kombucha drinks. (Store-bought pasteurized versions of these are okay.) °Salads that are made in a store or deli, such as ham salad, chicken salad, egg salad, tuna salad, and seafood salad. °The items listed above may not be a complete list of foods and beverages to avoid. Contact your dietitian for more information. °The items listed above may not be a complete list of foods and beverages [you/your child] should avoid. Contact a dietitian for more information. °Where to find more information °To calculate the number of calories you need based on your height, weight, and activity level, you can use an online calculator such as: °· www.choosemyplate.gov/MyPlatePlan °To calculate how much weight you should gain during pregnancy, you can use an online pregnancy weight gain calculator such as: °· www.choosemyplate.gov/pregnancy-weight-gain-calculator °Summary °· While you are pregnant, your body requires additional nutrition to help support your growing baby. °· Eat a variety of foods, especially fruits and vegetables to get a full range of vitamins and minerals. °· Practice good food safety and cleanliness. Wash your hands before you eat and after you prepare raw meat. Wash all fruits and vegetables well before peeling or eating. Taking these actions can help to prevent food-borne illnesses, such as listeriosis, that can be very dangerous to your baby. °· Do not eat raw  meat or fish. Do not eat fish that have high mercury content, such as tilefish, shark, swordfish, and king mackerel. Do not eat unpasteurized (raw) dairy. °· Take a prenatal vitamin to help meet your additional vitamin and mineral needs during pregnancy, specifically for folic acid, iron, calcium, and vitamin D. °This information is not intended to replace advice given to you by your health care provider. Make sure you discuss any questions you have with your health care provider. °Document Released: 05/12/2014 Document Revised: 11/18/2018 Document Reviewed: 04/24/2017 °[employer]vier Patient Education © 2020 [employer] °Prenatal Care °Prenatal care is health care during pregnancy. It helps you and your unborn baby (fetus) stay as healthy as possible. Prenatal care may be provided by a midwife, a family practice health care provider, or a childbirth and pregnancy specialist (obstetrician). °How does this affect me? °During pregnancy, you will be closely monitored for any new conditions that might develop. To lower your risk of pregnancy complications, you and your health care provider will talk about any underlying conditions you have. °How does this affect my baby? °Early and consistent prenatal care increases the chance that your baby will be healthy during pregnancy. Prenatal care lowers the risk that your baby will be: °· Born early (prematurely). °· Smaller than expected at birth (small for gestational age). °What can I expect at the first prenatal care visit? °Your first prenatal care visit will likely be the longest. You should schedule your first prenatal care visit as soon as you know that you are pregnant. Your first visit is a good time to talk about any questions or concerns you have about pregnancy. At your visit, you and your health care provider will talk about: °· Your medical history, including: °? Any past pregnancies. °? Your family's medical history. °? The baby's father's medical history. °? Any  long-term (chronic) health conditions you have and how you manage them. °? Any surgeries or procedures you have had. °? Any current over-the-counter or prescription medicines, herbs, or supplements you are taking. °· Other factors that could pose a risk   to your baby, including: °· Your home setting and your stress levels, including: °? Exposure to abuse or violence. °? Household financial strain. °? Mental health conditions you have. °· Your daily health habits, including diet and exercise. °Your health care provider will also: °· Measure your weight, height, and blood pressure. °· Do a physical exam, including a pelvic and breast exam. °· Perform blood tests and urine tests to check for: °? Urinary tract infection. °? Sexually transmitted infections (STIs). °? Low iron levels in your blood (anemia). °? Blood type and certain proteins on red blood cells (Rh antibodies). °? Infections and immunity to viruses, such as hepatitis B and rubella. °? HIV (human immunodeficiency virus). °· Do an ultrasound to confirm your baby's growth and development and to help predict your estimated due date (EDD). This ultrasound is done with a probe that is inserted into the vagina (transvaginal ultrasound). °· Discuss your options for genetic screening. °· Give you information about how to keep yourself and your baby healthy, including: °? Nutrition and taking vitamins. °? Physical activity. °? How to manage pregnancy symptoms such as nausea and vomiting (morning sickness). °? Infections and substances that may be harmful to your baby and how to avoid them. °? Food safety. °? Dental care. °? Working. °? Travel. °? Warning signs to watch for and when to call your health care provider. °How often will I have prenatal care visits? °After your first prenatal care visit, you will have regular visits throughout your pregnancy. The visit schedule is often as follows: °· Up to week 28 of pregnancy: once every 4 weeks. °· 28-36 weeks: once  every 2 weeks. °· After 36 weeks: every week until delivery. °Some women may have visits more or less often depending on any underlying health conditions and the health of the baby. °Keep all follow-up and prenatal care visits as told by your health care provider. This is important. °What happens during routine prenatal care visits? °Your health care provider will: °· Measure your weight and blood pressure. °· Check for fetal heart sounds. °· Measure the height of your uterus in your abdomen (fundal height). This may be measured starting around week 20 of pregnancy. °· Check the position of your baby inside your uterus. °· Ask questions about your diet, sleeping patterns, and whether you can feel the baby move. °· Review warning signs to watch for and signs of labor. °· Ask about any pregnancy symptoms you are having and how you are dealing with them. Symptoms may include: °? Headaches. °? Nausea and vomiting. °? Vaginal discharge. °? Swelling. °? Fatigue. °? Constipation. °? Any discomfort, including back or pelvic pain. °Make a list of questions to ask your health care provider at your routine visits. °What tests might I have during prenatal care visits? °You may have blood, urine, and imaging tests throughout your pregnancy, such as: °· Urine tests to check for glucose, protein, or signs of infection. °· Glucose tests to check for a form of diabetes that can develop during pregnancy (gestational diabetes mellitus). This is usually done around week 24 of pregnancy. °· An ultrasound to check your baby's growth and development and to check for birth defects. This is usually done around week 20 of pregnancy. °· A test to check for group B strep (GBS) infection. This is usually done around week 36 of pregnancy. °· Genetic testing. This may include blood or imaging tests, such as an ultrasound. Some genetic tests are done during the first trimester   and some are done during the second trimester. °What else can I expect  during prenatal care visits? °Your health care provider may recommend getting certain vaccines during pregnancy. These may include: °· A yearly flu shot (annual influenza vaccine). This is especially important if you will be pregnant during flu season. °· Tdap (tetanus, diphtheria, pertussis) vaccine. Getting this vaccine during pregnancy can protect your baby from whooping cough (pertussis) after birth. This vaccine may be recommended between weeks 27 and 36 of pregnancy. °Later in your pregnancy, your health care provider may give you information about: °· Childbirth and breastfeeding classes. °· Choosing a health care provider for your baby. °· Umbilical cord banking. °· Breastfeeding. °· Birth control after your baby is born. °· The hospital labor and delivery unit and how to tour it. °· Registering at the hospital before you go into labor. °Where to find more information °· Office on Women's Health: womenshealth.gov °· American Pregnancy Association: americanpregnancy.org °· March of Dimes: marchofdimes.org °Summary °· Prenatal care helps you and your baby stay as healthy as possible during pregnancy. °· Your first prenatal care visit will most likely be the longest. °· You will have visits and tests throughout your pregnancy to monitor your health and your baby's health. °· Bring a list of questions to your visits to ask your health care provider. °· Make sure to keep all follow-up and prenatal care visits with your health care provider. °This information is not intended to replace advice given to you by your health care provider. Make sure you discuss any questions you have with your health care provider. °Document Released: 07/31/2003 Document Revised: 11/17/2018 Document Reviewed: 07/27/2017 °Elsevier Patient Education © 2020 Elsevier Inc. ° ° ° °COVID-19 and Your Pregnancy FAQ ° °How can I prevent infection with COVID-19 during my pregnancy? °Social distancing is key. Please limit any interactions in  public. Try and work from home if possible. °Frequently wash your hands after touching possibly contaminated surfaces. °Avoid touching your face.  °Minimize trips to the store. Consider online ordering when possible.  ° °Should I wear a mask? °YES. It is recommended by the CDC that all people wear a cloth mask or facial covering in public. You should wear a mask to your visits in the office. This will help reduce transmission as well as your risk or acquiring COVID-19. New studies are showing that even asymptomatic individuals can spread the virus from talking.  ° °Where can I get a mask? °[place] and the city of [place] are partnering to provide masks to community members. You can pick up a mask from several locations. This website also has instructions about how to make a mask by sewing or without sewing by using a t-shirt or bandana.  °https://www.[place].gov/i-want-to/learn-about/covid-19-information-and-updates/covid-19-face-mask-project ° °Studies have shown that if you were a tube or nylon stocking from pantyhose over a cloth mask it makes the cloth mask almost as effective as a N95 mask.  °https://www.npr.org/sections/goatsandsoda/2018/12/01/840146830/adding-a-nylon-stocking-layer-could-boost-protection-from-cloth-masks-study-find ° °What are the symptoms of COVID-19? °Fever (greater than 100.4 F), dry cough, shortness of breath. ° °Am I more at risk for COVID-19 since I am pregnant? °There is not currently data showing that pregnant women are more adversely impacted by COVID-19 than the general population. However, we know that pregnant women tend to have worse respiratory complications from similar diseases such as the flu and SARS and for this reason should be considered an at-risk population. ° °What do I do if I am experiencing the symptoms of COVID-19? °Testing is being   limited because of test availability. If you are experiencing symptoms you should quarantine yourself, and the members of  your family, for at least 2 weeks at home.  ° °Please visit this website for more information: °https://www.cdc.gov/coronavirus/2019-ncov/if-you-are-sick/steps-when-sick.html ° °When should I go to the Emergency Room? °Please go to the emergency room if you are experiencing ANY of these symptoms*: ° °1.    Difficulty breathing or shortness of breath °2.    Persistent pain or pressure in the chest °3.    Confusion or difficulty being aroused (or awakened) °4.    Bluish lips or face ° °*This list is not all inclusive. Please consult our office for any other symptoms that are severe or concerning. ° °What do I do if I am having difficulty breathing? °You should go to the Emergency Room for evaluation. At this time they have a tent set up for evaluating patients with COVID-19 symptoms.  ° °How will my prenatal care be different because of the COVID-19 pandemic? °It has been recommended to reduce the frequency of face-to-face visits and use resources such as telephone and virtual visits when possible. Using a scale, blood pressure machine and fetal doppler at home can further help reduce face-to-face visits. You will be provided with additional information on this topic.  We ask that you come to your visits alone to minimize potential exposures to  COVID-19. ° °How can I receive childbirth education? °At this time in-person classes have been cancelled. You can register for online childbirth education, breastfeeding, and newborn care classes.  °Please visit:  [email or web address] for more information ° °How will my hospital birth experience be different? °The hospital is currently limiting visitors. This means that while you are in labor you can only have one person at the hospital with you. Additional family members will not be allowed to wait in the building or outside your room. Your one support person can be the father of the baby, a relative, a doula, or a friend. Once one support person is designated that  person will wear a band. This band cannot be shared with multiple people. ° °Nitrous Gas is not being offered for pain relief since the tubing and filter for the machine can not be sanitized in a way to guarantee prevention of transmission of COVID-19. ° °Nasal cannula use of oxygen for fetal indications has also been discontinued. ° °Currently a clear plastic sheet is being hung between mom and the delivering provider during pushing and delivery to help prevent transmission of COVID-19.     ° °How long will I stay in the hospital for after giving birth? °It is also recommended that discharge home be expedited during the COVID-19 outbreak. This means staying for 1 day after a vaginal delivery and 2 days after a cesarean section. Patients who need to stay longer for medical reasons are allowed to do so, but the goal will be for expedited discharge home.  ° °What if I have COVID-19 and I am in labor? °We ask that you wear a mask while on labor and delivery. We will try and accommodate you being placed in a room that is capable of filtering the air. Please call ahead if you are in labor and on your way to the hospital. The phone number for labor and delivery at [facility] is [phone]. ° °If I have COVID-19 when my baby is born how can I prevent my baby from contracting COVID-19? °This is an issue that will have to   be discussed on a case-by-case basis. Current recommendations suggest providing separate isolation rooms for both the mother and new infant as well as limiting visitors. However, there are practical challenges to this recommendation. The situation will assuredly change and decisions will be influenced by the desires of the mother and availability of space.  Some suggestions are the use of a curtain or physical barrier between mom and infant, hand hygiene, mom wearing a mask, or 6 feet of spacing between a mom and infant.  ° °Can I breastfeed during the COVID-19 pandemic?   °Yes,  breastfeeding is encouraged. ° °Can I breastfeed if I have COVID-19? °Yes. Covid-19 has not been found in breast milk. This means you cannot give COVID-19 to your child through breast milk. Breast feeding will also help pass antibodies to fight infection to your baby.  ° °What precautions should I take when breastfeeding if I have COVID-19? °If a mother and newborn do room-in and the mother wishes to feed at the breast, she should put on a facemask and practice hand hygiene before each feeding. ° °What precautions should I take when pumping if I have COVID-19? °Prior to expressing breast milk, mothers should practice hand hygiene. After each pumping session, all parts that come into contact with breast milk should be thoroughly washed and the entire pump should be appropriately disinfected per the manufacturer’s instructions. This expressed breast milk should be fed to the newborn by a healthy caregiver. ° °What if I am pregnant and work in healthcare? °Based on limited data regarding COVID-19 and pregnancy, ACOG currently does not propose creating additional restrictions on pregnant health care personnel because of COVID-19 alone. Pregnant women do not appear to be at higher risk of severe disease related to COVID-19. Pregnant health care personnel should follow CDC risk assessment and infection control guidelines for health care personnel exposed to patients with suspected or confirmed COVID-19. Adherence to recommended infection prevention and control practices is an important part of protecting all health care personnel in health care settings.   ° °Information on COVID-19 in pregnancy is very limited; however, facilities may want to consider limiting exposure of pregnant health care personnel to patients with confirmed or suspected COVID-19 infection, especially during higher-risk procedures (eg, aerosol-generating procedures), if feasible, based on staffing availability. ° ° ° °

## 2019-06-22 NOTE — Progress Notes (Signed)
NOB LMP 04/16/2019

## 2019-06-22 NOTE — Progress Notes (Addendum)
New Obstetric Patient H&P    Chief Complaint: "Desires prenatal care"   History of Present Illness: Patient is a 23 y.o. G1P0000 Hispanic or Latino female, presents with amenorrhea and positive home pregnancy test. Patient's last menstrual period was 04/16/2019 (exact date). and based on her  LMP, her EDD is Estimated Date of Delivery: 01/21/20 and her EGA is [id number]. Cycles are 6. days, regular, and occur approximately every : 30 days. First PAP smear today.   She had a urine pregnancy test which was positive 2 or 3 week(s)  ago. Her last menstrual period was normal and lasted for  5 or 6 day(s). Since her LMP she claims she has experienced breast tenderness, fatigue, nausea, vomiting. She denies vaginal bleeding. Her past medical history is noncontributory. This is her first pregnancy.  Since her LMP, she admits to the use of tobacco products  She quit with positive pregnancy test She claims she has lost  6 pounds since the start of her pregnancy.  There are cats in the home in the home  no  She admits close contact with children on a regular basis  no She has had chicken pox in the past no She has had Tuberculosis exposures, symptoms, or previously tested positive for TB   no Current or past history of domestic violence. no  Genetic Screening/Teratology Counseling: (Includes patient, baby's father, or anyone in either family with:)   1. [facility]= [facility]  no 2. Thalassemia ([place], [place], Mediterranean, or Asian background): MCV<80  no 3. Neural tube defect (meningomyelocele, spina bifida, anencephaly)  no 4. Congenital heart defect  no  5. Down syndrome  no 6. Tay-Sachs (Jewish, [place])  no 7. Canavan's Disease  no 8. Sickle cell disease or trait (African)  no  9. Hemophilia or other blood disorders  no  10. Muscular dystrophy  no  11. Cystic fibrosis  no  12. Huntington's Chorea  no  13. Mental retardation/autism  no 14. Other inherited genetic or chromosomal  disorder  no 15. Maternal metabolic disorder (DM, PKU, etc)  no 16. Patient or FOB with a child with a birth defect not listed above no  16a. Patient or FOB with a birth defect themselves no 17. Recurrent pregnancy loss, or stillbirth  no  18. Any medications since LMP other than prenatal vitamins (include vitamins, supplements, OTC meds, drugs, alcohol)  Was using MJ daily and says she has quit with news of pregnancy 19. Any other genetic/environmental exposure to discuss  no  Infection History:   1. Lives with someone with TB or TB exposed  no  2. Patient or partner has history of genital herpes  no 3. Rash or viral illness since LMP  no 4. History of STI (GC, CT, HPV, syphilis, HIV)  no 5. History of recent travel :  no  Other pertinent information:  no     Review of Systems:10 point review of systems negative unless otherwise noted in HPI  Past Medical History:  Past Medical History:  Diagnosis Date  . DUB (dysfunctional uterine bleeding)   . Pelvic pain     Past Surgical History:  Past Surgical History:  Procedure Laterality Date  . NO PAST SURGERIES      Gynecologic History: Patient's last menstrual period was 04/16/2019 (exact date).  Obstetric History: G1P0000  Family History:  Family History  Problem Relation Age of Onset  . Breast cancer Neg Hx   . Ovarian cancer Neg Hx   .  Colon cancer Neg Hx   . Diabetes Neg Hx   . Heart disease Neg Hx     Social History:  Social History   Socioeconomic History  . Marital status: Single    Spouse name: Not on file  . Number of children: Not on file  . Years of education: Not on file  . Highest education level: Not on file  Occupational History  . Not on file  Social Needs  . Financial resource strain: Not on file  . Food insecurity    Worry: Not on file    Inability: Not on file  . Transportation needs    Medical: Not on file    Non-medical: Not on file  Tobacco Use  . Smoking status: Former Smoker     Types: Cigarettes  . Smokeless tobacco: Never Used  . Tobacco comment: quit with positive pregnancy test  Substance and Sexual Activity  . Alcohol use: Yes    Alcohol/week: 0.0 standard drinks    Comment: occas  . Drug use: Not Currently    Types: Marijuana  . Sexual activity: Yes    Birth control/protection: None  Lifestyle  . Physical activity    Days per week: Not on file    Minutes per session: Not on file  . Stress: Not on file  Relationships  . Social Herbalist on phone: Not on file    Gets together: Not on file    Attends religious service: Not on file    Active member of club or organization: Not on file    Attends meetings of clubs or organizations: Not on file    Relationship status: Not on file  . Intimate partner violence    Fear of current or ex partner: Not on file    Emotionally abused: Not on file    Physically abused: Not on file    Forced sexual activity: Not on file  Other Topics Concern  . Not on file  Social History Narrative  . Not on file    Allergies:  No Known Allergies  Medications: Prior to Admission medications   Medication Sig Start Date End Date Taking? Authorizing Provider  Prenatal Vit-Fe Fumarate-FA (PRENATAL VITAMINS) 28-0.8 MG TABS Take 28 mg by mouth daily. 06/08/19 09/06/19 Yes Caren Macadam, MD    Physical Exam Vitals: Blood pressure 118/78, weight 191 lb (86.6 kg), last menstrual period 04/16/2019.  General: NAD HEENT: normocephalic, anicteric Thyroid: no enlargement, no palpable nodules Pulmonary: No increased work of breathing, CTAB Cardiovascular: RRR, distal pulses 2+ Abdomen: NABS, soft, non-tender, non-distended.  Umbilicus without lesions.  No hepatomegaly, splenomegaly or masses palpable. No evidence of hernia  Genitourinary:  External: Normal external female genitalia.  Normal urethral meatus, normal Bartholin's and Skene's glands.    Vagina: Normal vaginal mucosa, no evidence of prolapse.     Cervix: Grossly normal in appearance, no bleeding  Uterus:  Non-enlarged, mobile, normal contour.  No CMT  Adnexa: ovaries non-enlarged, no adnexal masses  Rectal: deferred Extremities: no edema, erythema, or tenderness Neurologic: Grossly intact Psychiatric: mood appropriate, affect full  The following were addressed during this visit:  Breastfeeding Education - Early initiation of breastfeeding    Comments: Keeps milk supply adequate, helps contract uterus and slow bleeding, and early milk is the perfect first food and is easy to digest.   - The importance of exclusive breastfeeding    Comments: Provides antibodies, Lower risk of breast and ovarian cancers, and type-2 diabetes,Helps your  body recover, Reduced chance of SIDS.   - The importance of early skin-to-skin contact    Comments: Keeps baby warm and secure, helps keep baby's blood sugar up and breathing steady, easier to bond and breastfeed, and helps calm baby.   Assessment: 27 y.o. G1P0000 at [redacted]w[redacted]d presenting to initiate prenatal care  Plan: 1) Avoid alcoholic beverages. 2) Patient encouraged not to smoke.  3) Discontinue the use of all non-medicinal drugs and chemicals.  4) Take prenatal vitamins daily.  5) Nutrition, food safety (fish, cheese advisories, and high nitrite foods) and exercise discussed. 6) Hospital and practice style discussed with cross coverage system.  7) Genetic Screening, such as with 1st Trimester Screening, cell free fetal DNA, AFP testing, and Ultrasound, as well as with amniocentesis and CVS as appropriate, is discussed with patient. At the conclusion of today's visit patient requested cell free DNA genetic testing 8) Patient is asked about travel to areas at risk for the Zika virus, and counseled to avoid travel and exposure to mosquitoes or sexual partners who may have themselves been exposed to the virus. Testing is discussed, and will be ordered as appropriate.  9) PAPtima, urine culture, UDS  today 10) Return to clinic in 1 week for dating and rob 11) NOB panel, sickle cell screen, early 1 hr gtt, MaterniT 21 at 10+wk   Tresea Mall, CNM [facility] 06/22/2019, 1:14 PM

## 2019-06-24 LAB — URINE CULTURE

## 2019-06-27 LAB — CYTOLOGY - PAP
Chlamydia: NEGATIVE
Comment: NEGATIVE
Comment: NEGATIVE
Comment: NORMAL
Diagnosis: NEGATIVE
Neisseria Gonorrhea: NEGATIVE
Trichomonas: NEGATIVE

## 2019-06-28 LAB — URINE DRUG PANEL 7
Amphetamines, Urine: NEGATIVE ng/mL
Barbiturate Quant, Ur: NEGATIVE ng/mL
Benzodiazepine Quant, Ur: NEGATIVE ng/mL
Cannabinoid Quant, Ur: POSITIVE — AB
Cocaine (Metab.): NEGATIVE ng/mL
Opiate Quant, Ur: NEGATIVE ng/mL
PCP Quant, Ur: NEGATIVE ng/mL

## 2019-06-30 ENCOUNTER — Ambulatory Visit (INDEPENDENT_AMBULATORY_CARE_PROVIDER_SITE_OTHER): Payer: Self-pay

## 2019-06-30 ENCOUNTER — Encounter: Payer: Self-pay | Admitting: Advanced Practice Midwife

## 2019-06-30 ENCOUNTER — Other Ambulatory Visit: Payer: Self-pay

## 2019-06-30 ENCOUNTER — Ambulatory Visit (INDEPENDENT_AMBULATORY_CARE_PROVIDER_SITE_OTHER): Payer: Self-pay | Admitting: Advanced Practice Midwife

## 2019-06-30 VITALS — BP 130/80 | Wt 189.0 lb

## 2019-06-30 DIAGNOSIS — Z3401 Encounter for supervision of normal first pregnancy, first trimester: Secondary | ICD-10-CM

## 2019-06-30 DIAGNOSIS — N8311 Corpus luteum cyst of right ovary: Secondary | ICD-10-CM

## 2019-06-30 DIAGNOSIS — Z3A09 9 weeks gestation of pregnancy: Secondary | ICD-10-CM

## 2019-06-30 DIAGNOSIS — Z3400 Encounter for supervision of normal first pregnancy, unspecified trimester: Secondary | ICD-10-CM

## 2019-06-30 DIAGNOSIS — O3481 Maternal care for other abnormalities of pelvic organs, first trimester: Secondary | ICD-10-CM

## 2019-06-30 LAB — POCT URINALYSIS DIPSTICK OB
Glucose, UA: NEGATIVE
POC,PROTEIN,UA: NEGATIVE

## 2019-06-30 NOTE — Addendum Note (Signed)
Addended by: Drenda Freeze on: 06/30/2019 03:52 PM   Modules accepted: Orders

## 2019-06-30 NOTE — Progress Notes (Signed)
  Routine Prenatal Care Visit  Subjective  Dawn Richards is a 23 y.o. G1P0000 at [redacted]w[redacted]d being seen today for ongoing prenatal care.  She is currently monitored for the following issues for this low-risk pregnancy and has Female dyspareunia; Dysmenorrhea; Abnormal uterine bleeding (AUB); Adjustment disorder with mixed disturbance of emotions and conduct; NSAID overdose, intentional self-harm, sequela ([place]); and Supervision of normal first pregnancy on their problem list.  ----------------------------------------------------------------------------------- Patient reports no complaints.  We discussed results of dating scan and adjustment of EDD.  . Vag. Bleeding: None.   . Leaking Fluid denies.  ----------------------------------------------------------------------------------- The following portions of the patient's history were reviewed and updated as appropriate: allergies, current medications, past family history, past medical history, past social history, past surgical history and problem list. Problem list updated.  Objective  Blood pressure 130/80, weight 189 lb (85.7 kg), last menstrual period 04/16/2019. Pregravid weight 197 lb (89.4 kg) Total Weight Gain -8 lb (-3.629 kg) Urinalysis: Urine Protein    Urine Glucose    Fetal Status: Fetal Heart Rate (bpm): 181           Dating scan: 9 weeks 4 days. EDD adjusted for 8 day difference.   General:  Alert, oriented and cooperative. Patient is in no acute distress.  Skin: Skin is warm and dry. No rash noted.   Cardiovascular: Normal heart rate noted  Respiratory: Normal respiratory effort, no problems with respiration noted  Abdomen: Soft, gravid, appropriate for gestational age.       Pelvic:  Cervical exam deferred        Extremities: Normal range of motion.     Mental Status: Normal mood and affect. Normal behavior. Normal judgment and thought content.   Assessment   23 y.o. G1P0000 at [redacted]w[redacted]d by  01/29/2020, by Ultrasound  presenting for routine prenatal visit  Plan   pregnancy Problems (from 06/22/19 to present)    Problem Noted Resolved   Supervision of normal first pregnancy 06/22/2019 by Rod Can, CNM No   Overview Addendum 06/22/2019  1:30 PM by Rod Can, [place] Prenatal Labs  Dating  Blood type:     Genetic Screen 1 Screen:    AFP:     Quad:     NIPS: Antibody:   Anatomic [place]  Rubella:   Varicella: @VZVIGG @  GTT Early:                Third trimester:  RPR: Non Reactive (11/21 0740)   Rhogam  HBsAg:     Vaccines TDAP:                       Flu Shot: 06/22/19 HIV: NON REACTIVE (11/21 0740)   Baby Food Breast                               GBS:   Contraception  Pap:  CBB     CS/VBAC NA   Support Person Boyfriend [name]              Preterm labor symptoms and general obstetric precautions including but not limited to vaginal bleeding, contractions, leaking of fluid and fetal movement were reviewed in detail with the patient.   Return in about 2 weeks (around 07/14/2019) for 1 hr gtt and rob.  Rod Can, CNM 06/30/2019 3:30 PM

## 2019-06-30 NOTE — Progress Notes (Signed)
ROB and Dating scan- no concerns

## 2019-07-18 ENCOUNTER — Encounter: Payer: Self-pay | Admitting: Obstetrics and Gynecology

## 2019-07-18 ENCOUNTER — Ambulatory Visit (INDEPENDENT_AMBULATORY_CARE_PROVIDER_SITE_OTHER): Payer: Self-pay | Admitting: Obstetrics and Gynecology

## 2019-07-18 ENCOUNTER — Other Ambulatory Visit: Payer: Self-pay

## 2019-07-18 VITALS — BP 124/78 | Wt 193.0 lb

## 2019-07-18 DIAGNOSIS — Z3401 Encounter for supervision of normal first pregnancy, first trimester: Secondary | ICD-10-CM

## 2019-07-18 DIAGNOSIS — Z3A12 12 weeks gestation of pregnancy: Secondary | ICD-10-CM

## 2019-07-18 DIAGNOSIS — Z1379 Encounter for other screening for genetic and chromosomal anomalies: Secondary | ICD-10-CM

## 2019-07-18 NOTE — Progress Notes (Signed)
  Routine Prenatal Care Visit  Subjective  Dawn Richards is a 23 y.o. G1P0000 at [redacted]w[redacted]d being seen today for ongoing prenatal care.  She is currently monitored for the following issues for this low-risk pregnancy and has Female dyspareunia; Dysmenorrhea; Abnormal uterine bleeding (AUB); Adjustment disorder with mixed disturbance of emotions and conduct; NSAID overdose, intentional self-harm, sequela ([place]); and Supervision of normal first pregnancy on their problem list.  ----------------------------------------------------------------------------------- Patient reports no complaints.    . Vag. Bleeding: None.   . Leaking Fluid denies.  ----------------------------------------------------------------------------------- The following portions of the patient's history were reviewed and updated as appropriate: allergies, current medications, past family history, past medical history, past social history, past surgical history and problem list. Problem list updated.  Objective  Blood pressure 124/78, weight 193 lb (87.5 kg), last menstrual period 04/16/2019. Pregravid weight 197 lb (89.4 kg) Total Weight Gain -4 lb (-1.814 kg) Urinalysis: Urine Protein    Urine Glucose    Fetal Status: Fetal Heart Rate (bpm): 155         General:  Alert, oriented and cooperative. Patient is in no acute distress.  Skin: Skin is warm and dry. No rash noted.   Cardiovascular: Normal heart rate noted  Respiratory: Normal respiratory effort, no problems with respiration noted  Abdomen: Soft, gravid, appropriate for gestational age.       Pelvic:  Cervical exam deferred        Extremities: Normal range of motion.     Mental Status: Normal mood and affect. Normal behavior. Normal judgment and thought content.   Assessment   23 y.o. G1P0000 at [redacted]w[redacted]d by  01/29/2020, by Ultrasound presenting for routine prenatal visit  Plan   pregnancy Problems (from 06/22/19 to present)    Problem Noted Resolved   Supervision of normal first pregnancy 06/22/2019 by Rod Can, CNM No   Overview Addendum 06/22/2019  1:30 PM by Rod Can, [place] Prenatal Labs  Dating  Blood type:     Genetic Screen 1 Screen:    AFP:     Quad:     NIPS: Antibody:   Anatomic [place]  Rubella:   Varicella: @VZVIGG @  GTT Early:                Third trimester:  RPR: Non Reactive (11/21 0740)   Rhogam  HBsAg:     Vaccines TDAP:                       Flu Shot: 06/22/19 HIV: NON REACTIVE (11/21 0740)   Baby Food Breast                               GBS:   Contraception  Pap:  CBB     CS/VBAC NA   Support Person Boyfriend [name]              Preterm labor symptoms and general obstetric precautions including but not limited to vaginal bleeding, contractions, leaking of fluid and fetal movement were reviewed in detail with the patient. Please refer to After Visit Summary for other counseling recommendations.   - NIPT and NOB labs today - 1h gtt in 2 weeks  Return in about 2 weeks (around 08/01/2019) for 1h gtt and routine prenatal.  Prentice Docker, MD, [place], [facility] 07/18/2019 3:43 PM

## 2019-07-23 LAB — MATERNIT21 PLUS CORE+SCA
Fetal Fraction: 7
Monosomy X (Turner Syndrome): NOT DETECTED
Result (T21): NEGATIVE
Trisomy 13 (Patau syndrome): NEGATIVE
Trisomy 18 (Edwards syndrome): NEGATIVE
Trisomy 21 (Down syndrome): NEGATIVE
XXX (Triple X Syndrome): NOT DETECTED
XXY (Klinefelter Syndrome): NOT DETECTED
XYY (Jacobs Syndrome): NOT DETECTED

## 2019-07-25 NOTE — Progress Notes (Signed)
Do you mind letting her know that the genetic screening was all negative? You could also tell her the gender of the baby, if she wants to know (See the "Fetal Sex" category).

## 2019-07-26 ENCOUNTER — Telehealth: Payer: Self-pay

## 2019-07-26 NOTE — Telephone Encounter (Signed)
-----   Message from Will Bonnet, MD sent at 07/25/2019  7:05 PM EST ----- Do you mind letting her know that the genetic screening was all negative? You could also tell her the gender of the baby, if she wants to know (See the "Fetal Sex" category).

## 2019-07-26 NOTE — Telephone Encounter (Signed)
Trying to call pt to let her know results. Called the number in her chart and couldn't reach her, wrong number maybe? Please let me know if she calls for results. Thank you.

## 2019-08-01 ENCOUNTER — Other Ambulatory Visit: Payer: Medicaid Other

## 2019-08-01 ENCOUNTER — Other Ambulatory Visit: Payer: Self-pay

## 2019-08-01 ENCOUNTER — Ambulatory Visit (INDEPENDENT_AMBULATORY_CARE_PROVIDER_SITE_OTHER): Payer: Self-pay | Admitting: Obstetrics & Gynecology

## 2019-08-01 ENCOUNTER — Encounter: Payer: Self-pay | Admitting: Obstetrics & Gynecology

## 2019-08-01 VITALS — BP 120/80 | Wt 191.0 lb

## 2019-08-01 DIAGNOSIS — Z3400 Encounter for supervision of normal first pregnancy, unspecified trimester: Secondary | ICD-10-CM

## 2019-08-01 DIAGNOSIS — Z6836 Body mass index (BMI) 36.0-36.9, adult: Secondary | ICD-10-CM

## 2019-08-01 DIAGNOSIS — O99210 Obesity complicating pregnancy, unspecified trimester: Secondary | ICD-10-CM

## 2019-08-01 DIAGNOSIS — Z3689 Encounter for other specified antenatal screening: Secondary | ICD-10-CM

## 2019-08-01 DIAGNOSIS — Z3401 Encounter for supervision of normal first pregnancy, first trimester: Secondary | ICD-10-CM

## 2019-08-01 DIAGNOSIS — Z3A14 14 weeks gestation of pregnancy: Secondary | ICD-10-CM

## 2019-08-01 DIAGNOSIS — Z113 Encounter for screening for infections with a predominantly sexual mode of transmission: Secondary | ICD-10-CM

## 2019-08-01 LAB — POCT URINALYSIS DIPSTICK OB
Glucose, UA: NEGATIVE
POC,PROTEIN,UA: NEGATIVE

## 2019-08-01 NOTE — Addendum Note (Signed)
Addended by: Quintella Baton D on: 08/01/2019 03:48 PM   Modules accepted: Orders

## 2019-08-01 NOTE — Progress Notes (Signed)
  Subjective  Nausea? no  Objective  BP 120/80   Wt 191 lb (86.6 kg)   LMP 04/16/2019 (Exact Date)   BMI 36.09 kg/m  General: NAD Pumonary: no increased work of breathing Abdomen: gravid, non-tender Extremities: no edema Psychiatric: mood appropriate, affect full  Assessment  23 y.o. G1P0000 at [redacted]w[redacted]d by  01/29/2020, by Ultrasound presenting for routine prenatal visit  Plan   Problem List Items Addressed This Visit      Other   Supervision of normal first pregnancy    Other Visit Diagnoses    [redacted] weeks gestation of pregnancy    -  Primary      pregnancy Problems (from 06/22/19 to present)    Problem Noted Resolved   Supervision of normal first pregnancy 06/22/2019 by Rod Can, CNM No   Overview Addendum 08/01/2019  3:35 PM by Gae Dry, MD    Clinic [facility] Prenatal Labs  Dating  LMP Blood type:     Genetic Screen   NIPS:nml XX Antibody:   Anatomic [place]  Rubella:   Varicella: @VZVIGG @  GTT Early:                Third trimester:  RPR:     Rhogam  HBsAg:     Vaccines TDAP:                       Flu Shot: 06/22/19 HIV:     Baby Food Breast                               GBS:   Contraception  Pap:06/2019 nml  CBB     CS/VBAC NA   Support Person Boyfriend [name]           Glucola and PNL today [place] nv   Barnett Applebaum, MD, [facility], [facility] 08/01/2019  3:42 PM

## 2019-08-01 NOTE — Patient Instructions (Signed)

## 2019-08-02 LAB — RPR+RH+ABO+RUB AB+AB SCR+CB...
Antibody Screen: NEGATIVE
HIV Screen 4th Generation wRfx: NONREACTIVE
Hematocrit: 30.8 % — ABNORMAL LOW (ref 34.0–46.6)
Hemoglobin: 10.5 g/dL — ABNORMAL LOW (ref 11.1–15.9)
Hepatitis B Surface Ag: NEGATIVE
MCH: 29.2 pg (ref 26.6–33.0)
MCHC: 34.1 g/dL (ref 31.5–35.7)
MCV: 86 fL (ref 79–97)
Platelets: 261 10*3/uL (ref 150–450)
RBC: 3.59 x10E6/uL — ABNORMAL LOW (ref 3.77–5.28)
RDW: 12.9 % (ref 11.7–15.4)
RPR Ser Ql: NONREACTIVE
Rh Factor: POSITIVE
Rubella Antibodies, IGG: <0.9 index — ABNORMAL LOW (ref >0.99)
Varicella zoster IgG: 240 index (ref >165)
WBC: 12 10*3/uL — ABNORMAL HIGH (ref 3.4–10.8)

## 2019-08-03 LAB — GLUCOSE, 1 HOUR GESTATIONAL: Gestational Diabetes Screen: 158 mg/dL — ABNORMAL HIGH (ref 65–139)

## 2019-08-03 LAB — HEMOGLOBINOPATHY EVALUATION
HGB C: 0 %
HGB S: 0 %
HGB VARIANT: 0 %
Hemoglobin A2 Quantitation: 2.4 % (ref 1.8–3.2)
Hemoglobin F Quantitation: 0 % (ref 0.0–2.0)
Hgb A: 97.6 % (ref 96.4–98.8)

## 2019-08-04 ENCOUNTER — Other Ambulatory Visit: Payer: Self-pay | Admitting: Advanced Practice Midwife

## 2019-08-04 DIAGNOSIS — R7309 Other abnormal glucose: Secondary | ICD-10-CM

## 2019-08-04 NOTE — Progress Notes (Unsigned)
Order placed for 3 hr gtt. Left message with FOB that patient should call to schedule 3 hr gtt.

## 2019-08-12 NOTE — L&D Delivery Note (Signed)
Delivery Note At 10:05, a viable female was delivered vaginally.  APGAR:7 9, ; weight  .   Placenta status:delivered intact, meconium stained  ,  .  Cord:3 vessel   with the following complications:  .  Cord pH: not done  Anesthesia:  none Episiotomy:  yes, small MLE due to fetal bradycardia Lacerations:  no, MLE only Suture Repair: 3.0 vicryl rapide Est. Blood Loss (mL):  370  Mom to postpartum.  Baby to Couplet care / Skin to Skin.  Mirna Mires 01/31/2020, 11:42 AM

## 2019-08-15 ENCOUNTER — Other Ambulatory Visit: Payer: Medicaid Other

## 2019-08-15 ENCOUNTER — Other Ambulatory Visit: Payer: Self-pay

## 2019-08-15 DIAGNOSIS — R7309 Other abnormal glucose: Secondary | ICD-10-CM

## 2019-08-16 LAB — GESTATIONAL GLUCOSE TOLERANCE
Glucose, Fasting: 85 mg/dL (ref 65–94)
Glucose, GTT - 1 Hour: 167 mg/dL (ref 65–179)
Glucose, GTT - 2 Hour: 123 mg/dL (ref 65–154)
Glucose, GTT - 3 Hour: 56 mg/dL — ABNORMAL LOW (ref 65–139)

## 2019-08-29 ENCOUNTER — Emergency Department: Payer: Self-pay

## 2019-08-29 ENCOUNTER — Encounter: Payer: Self-pay | Admitting: Emergency Medicine

## 2019-08-29 ENCOUNTER — Emergency Department
Admission: EM | Admit: 2019-08-29 | Discharge: 2019-08-29 | Disposition: A | Discharge status: Home / Self Care | Payer: Self-pay | Attending: Emergency Medicine | Admitting: Emergency Medicine

## 2019-08-29 ENCOUNTER — Other Ambulatory Visit: Payer: Self-pay

## 2019-08-29 DIAGNOSIS — Z87891 Personal history of nicotine dependence: Secondary | ICD-10-CM | POA: Insufficient documentation

## 2019-08-29 DIAGNOSIS — O26892 Other specified pregnancy related conditions, second trimester: Secondary | ICD-10-CM | POA: Insufficient documentation

## 2019-08-29 DIAGNOSIS — R109 Unspecified abdominal pain: Secondary | ICD-10-CM

## 2019-08-29 LAB — COMPREHENSIVE METABOLIC PANEL
ALT: 11 U/L (ref 0–44)
AST: 13 U/L — ABNORMAL LOW (ref 15–41)
Albumin: 3.5 g/dL (ref 3.5–5.0)
Alkaline Phosphatase: 54 U/L (ref 38–126)
Anion gap: 9 (ref 5–15)
BUN: 6 mg/dL (ref 6–20)
CO2: 19 mmol/L — ABNORMAL LOW (ref 22–32)
Calcium: 8.7 mg/dL — ABNORMAL LOW (ref 8.9–10.3)
Chloride: 108 mmol/L (ref 98–111)
Creatinine, Ser: 0.41 mg/dL — ABNORMAL LOW (ref 0.44–1.00)
GFR calc Af Amer: >60 mL/min (ref >60)
GFR calc non Af Amer: >60 mL/min (ref >60)
Glucose, Bld: 117 mg/dL — ABNORMAL HIGH (ref 70–99)
Potassium: 3.5 mmol/L (ref 3.5–5.1)
Sodium: 136 mmol/L (ref 135–145)
Total Bilirubin: 0.4 mg/dL (ref 0.3–1.2)
Total Protein: 7.2 g/dL (ref 6.5–8.1)

## 2019-08-29 LAB — LIPASE, BLOOD: Lipase: 18 U/L (ref 11–51)

## 2019-08-29 LAB — CBC
HCT: 32.1 % — ABNORMAL LOW (ref 36.0–46.0)
Hemoglobin: 10.7 g/dL — ABNORMAL LOW (ref 12.0–15.0)
MCH: 28.7 pg (ref 26.0–34.0)
MCHC: 33.3 g/dL (ref 30.0–36.0)
MCV: 86.1 fL (ref 80.0–100.0)
Platelets: 262 10*3/uL (ref 150–400)
RBC: 3.73 MIL/uL — ABNORMAL LOW (ref 3.87–5.11)
RDW: 12.9 % (ref 11.5–15.5)
WBC: 10.4 10*3/uL (ref 4.0–10.5)
nRBC: 0 % (ref 0.0–0.2)

## 2019-08-29 MED ORDER — CYCLOBENZAPRINE HCL 10 MG PO TABS
5.0000 mg | ORAL_TABLET | Freq: Once | ORAL | Status: AC
  Administered 2019-08-29: 5 mg via ORAL
  Filled 2019-08-29: qty 1

## 2019-08-29 MED ORDER — ACETAMINOPHEN 500 MG PO TABS
1000.0000 mg | ORAL_TABLET | Freq: Once | ORAL | Status: AC
  Administered 2019-08-29: 1000 mg via ORAL
  Filled 2019-08-29: qty 2

## 2019-08-29 MED ORDER — SODIUM CHLORIDE 0.9% FLUSH: 3.0000 mL | Freq: Once | INTRAVENOUS | Status: DC

## 2019-08-29 MED ORDER — CYCLOBENZAPRINE HCL 5 MG PO TABS: 5.0000 mg | ORAL_TABLET | Freq: Three times a day (TID) | ORAL | 0 refills | Status: DC | PRN
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
## 2019-08-29 NOTE — ED Notes (Signed)
Patient transported to MRI 
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
## 2019-08-29 NOTE — ED Notes (Signed)
Lights dimmed for patient comfort. Pt denies any further needs. Call bell within reach.
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
## 2019-09-05 ENCOUNTER — Other Ambulatory Visit: Payer: Self-pay

## 2019-09-05 ENCOUNTER — Ambulatory Visit (INDEPENDENT_AMBULATORY_CARE_PROVIDER_SITE_OTHER): Payer: Self-pay

## 2019-09-05 ENCOUNTER — Ambulatory Visit (INDEPENDENT_AMBULATORY_CARE_PROVIDER_SITE_OTHER): Payer: Self-pay | Admitting: Obstetrics and Gynecology

## 2019-09-05 ENCOUNTER — Encounter: Payer: Self-pay | Admitting: Obstetrics and Gynecology

## 2019-09-05 VITALS — BP 122/74 | Wt 193.0 lb

## 2019-09-05 DIAGNOSIS — Z363 Encounter for antenatal screening for malformations: Secondary | ICD-10-CM

## 2019-09-05 DIAGNOSIS — Z6836 Body mass index (BMI) 36.0-36.9, adult: Secondary | ICD-10-CM | POA: Insufficient documentation

## 2019-09-05 DIAGNOSIS — O99210 Obesity complicating pregnancy, unspecified trimester: Secondary | ICD-10-CM | POA: Insufficient documentation

## 2019-09-05 DIAGNOSIS — Z3402 Encounter for supervision of normal first pregnancy, second trimester: Secondary | ICD-10-CM

## 2019-09-05 DIAGNOSIS — Z3689 Encounter for other specified antenatal screening: Secondary | ICD-10-CM

## 2019-09-05 DIAGNOSIS — O99212 Obesity complicating pregnancy, second trimester: Secondary | ICD-10-CM

## 2019-09-05 DIAGNOSIS — Z3A19 19 weeks gestation of pregnancy: Secondary | ICD-10-CM

## 2019-09-05 NOTE — Progress Notes (Signed)
  Routine Prenatal Care Visit  Subjective  Dawn Richards is a 24 y.o. G1P0000 at [redacted]w[redacted]d being seen today for ongoing prenatal care.  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  ----------------------------------------------------------------------------------- Patient reports no complaints.    . Vag. Bleeding: None.  Movement: Present. Leaking Fluid denies.  Anatomy u/s complete today ----------------------------------------------------------------------------------- The following portions of the patient's history were reviewed and updated as appropriate: allergies, current medications, past family history, past medical history, past social history, past surgical history and problem list. Problem list updated.  Objective  Blood pressure 122/74, weight 193 lb (87.5 kg), last menstrual period 04/16/2019. Pregravid weight 197 lb (89.4 kg) Total Weight Gain -4 lb (-1.814 kg) Urinalysis: Urine Protein    Urine Glucose    Fetal Status: Fetal Heart Rate (bpm): 145   Movement: Present     General:  Alert, oriented and cooperative. Patient is in no acute distress.  Skin: Skin is warm and dry. No rash noted.   Cardiovascular: Normal heart rate noted  Respiratory: Normal respiratory effort, no problems with respiration noted  Abdomen: Soft, gravid, appropriate for gestational age. Pain/Pressure: Absent     Pelvic:  Cervical exam deferred        Extremities: Normal range of motion.     Mental Status: Normal mood and affect. Normal behavior. Normal judgment and thought content.   Assessment   24 y.o. G1P0000 at [redacted]w[redacted]d by  01/29/2020, by Ultrasound  presenting for routine prenatal visit  Plan   pregnancy Problems (from 06/22/19 to present)    Problem Noted Resolved   Obesity affecting pregnancy 09/05/2019 by Conard Novak, MD No   BMI 36.0-36.9,adult 09/05/2019 by Conard Novak, MD No   Supervision of normal first pregnancy 06/22/2019 by Tresea Mall, CNM No   Overview Addendum 08/01/2019  3:35 PM by Nadara Mustard, MD    Clinic [facility] Prenatal Labs  Dating  LMP Blood type:     Genetic Screen   NIPS:nml XX Antibody:   Anatomic [place]  Rubella:   Varicella: @VZVIGG @  GTT Early:                Third trimester:  RPR:     Rhogam  HBsAg:     Vaccines TDAP:                       Flu Shot: 06/22/19 HIV:     Baby Food Breast                               GBS:   Contraception  Pap:06/2019 nml  CBB     CS/VBAC NA   Support Person Boyfriend [name]              Preterm labor symptoms and general obstetric precautions including but not limited to vaginal bleeding, contractions, leaking of fluid and fetal movement were reviewed in detail with the patient. Please refer to After Visit Summary for other counseling recommendations.   Return in about 4 weeks (around 10/03/2019) for Routine Prenatal Appointment.  10/05/2019, MD, [facility], [facility] 09/05/2019 10:28 AM

## 2019-09-23 ENCOUNTER — Telehealth: Payer: Self-pay

## 2019-09-23 NOTE — Telephone Encounter (Signed)
Pt calling; needs letter for work stating she is not high risk ASAP.  [phone]  Voicemail not set up yet.  Letter up front for pt to p/u.

## 2019-10-03 ENCOUNTER — Ambulatory Visit (INDEPENDENT_AMBULATORY_CARE_PROVIDER_SITE_OTHER): Payer: Self-pay | Admitting: Obstetrics and Gynecology

## 2019-10-03 ENCOUNTER — Encounter: Payer: Self-pay | Admitting: Obstetrics and Gynecology

## 2019-10-03 ENCOUNTER — Other Ambulatory Visit: Payer: Self-pay

## 2019-10-03 VITALS — BP 122/78 | Wt 195.0 lb

## 2019-10-03 DIAGNOSIS — Z3402 Encounter for supervision of normal first pregnancy, second trimester: Secondary | ICD-10-CM

## 2019-10-03 DIAGNOSIS — O99212 Obesity complicating pregnancy, second trimester: Secondary | ICD-10-CM

## 2019-10-03 DIAGNOSIS — Z3A23 23 weeks gestation of pregnancy: Secondary | ICD-10-CM

## 2019-10-03 NOTE — Progress Notes (Signed)
    Routine Prenatal Care Visit  Subjective  Dawn Richards is a 24 y.o. G1P0000 at [redacted]w[redacted]d being seen today for ongoing prenatal care.  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  ----------------------------------------------------------------------------------- Patient reports no complaints.   Contractions: Not present. Vag. Bleeding: None.  Movement: Present. Denies leaking of fluid.  ----------------------------------------------------------------------------------- The following portions of the patient's history were reviewed and updated as appropriate: allergies, current medications, past family history, past medical history, past social history, past surgical history and problem list. Problem list updated.   Objective  Blood pressure 122/78, weight 195 lb (88.5 kg), last menstrual period 04/16/2019. Pregravid weight 197 lb (89.4 kg) Total Weight Gain -2 lb (-0.907 kg) Urinalysis:      Fetal Status: Fetal Heart Rate (bpm): 160   Movement: Present     General:  Alert, oriented and cooperative. Patient is in no acute distress.  Skin: Skin is warm and dry. No rash noted.   Cardiovascular: Normal heart rate noted  Respiratory: Normal respiratory effort, no problems with respiration noted  Abdomen: Soft, gravid, appropriate for gestational age. Pain/Pressure: Absent     Pelvic:  Cervical exam deferred        Extremities: Normal range of motion.  Edema: None  Mental Status: Normal mood and affect. Normal behavior. Normal judgment and thought content.     Assessment   24 y.o. G1P0000 at [redacted]w[redacted]d by  01/29/2020, by Ultrasound presenting for  routine prenatal visit  Plan   pregnancy Problems (from 06/22/19 to present)    Problem Noted Resolved   Obesity affecting pregnancy 09/05/2019 by Conard Novak, MD No   BMI 36.0-36.9,adult 09/05/2019 by Conard Novak, MD No   Supervision of normal first pregnancy 06/22/2019 by Tresea Mall, CNM No   Overview Addendum 10/03/2019  9:30 AM by Natale Milch, MD    Clinic [facility] Prenatal Labs  Dating  LMP Blood type: A/Positive/-- (12/21 1601)   Genetic Screen   NIPS:nml XX Antibody:Negative (12/21 1601)  Anatomic [place] complete Rubella: Nonimmune Varicella: ?  GTT Early:1Hr:158  3hr NML  Third trimester:  RPR: Non Reactive (12/21 1601)   Rhogam Not needed HBsAg: Negative (12/21 1601)   Vaccines TDAP:                       Flu Shot: 06/22/19 HIV: Non Reactive (12/21 1601)   Baby Food Breast                               GBS:   Contraception  Pap:06/2019 nml  CBB     CS/VBAC NA   Support Person Boyfriend [name]             Gestational age appropriate obstetric precautions including but not limited to vaginal bleeding, contractions, leaking of fluid and fetal movement were reviewed in detail with the patient.    Discusses [email or web address] Discussed prenatal classes through hospital.  Labs ordered for next visit.   Return in about 4 weeks (around 10/31/2019) for ROB in person and 3hr GTT.  Natale Milch MD [facility], [facility] 10/03/2019, 9:35 AM

## 2019-10-03 NOTE — Progress Notes (Signed)
ROB No concerns Denies lof, no vb, Some FM

## 2019-10-31 ENCOUNTER — Encounter: Payer: Medicaid Other | Admitting: Obstetrics & Gynecology

## 2019-10-31 ENCOUNTER — Other Ambulatory Visit: Payer: Medicaid Other

## 2019-11-15 ENCOUNTER — Encounter: Payer: Self-pay | Admitting: Advanced Practice Midwife

## 2019-11-15 ENCOUNTER — Other Ambulatory Visit: Payer: Self-pay

## 2019-11-15 ENCOUNTER — Other Ambulatory Visit: Payer: Medicaid Other

## 2019-11-15 ENCOUNTER — Ambulatory Visit (INDEPENDENT_AMBULATORY_CARE_PROVIDER_SITE_OTHER): Payer: Self-pay | Admitting: Advanced Practice Midwife

## 2019-11-15 ENCOUNTER — Other Ambulatory Visit: Payer: Self-pay | Admitting: Obstetrics and Gynecology

## 2019-11-15 VITALS — BP 120/76 | Wt 194.0 lb

## 2019-11-15 DIAGNOSIS — R7309 Other abnormal glucose: Secondary | ICD-10-CM

## 2019-11-15 DIAGNOSIS — Z3A29 29 weeks gestation of pregnancy: Secondary | ICD-10-CM

## 2019-11-15 NOTE — Progress Notes (Signed)
No vb. No lof. Pt will have to r/s 3 hr gtt

## 2019-11-15 NOTE — Patient Instructions (Signed)
Third Trimester of Pregnancy The third trimester is from week 28 through week 40 (months 7 through 9). The third trimester is a time when the unborn baby (fetus) is growing rapidly. At the end of the ninth month, the fetus is about 20 inches in length and weighs 6-10 pounds. Body changes during your third trimester Your body will continue to go through many changes during pregnancy. The changes vary from woman to woman. During the third trimester:  Your weight will continue to increase. You can expect to gain 25-35 pounds (11-16 kg) by the end of the pregnancy.  You may begin to get stretch marks on your hips, abdomen, and breasts.  You may urinate more often because the fetus is moving lower into your pelvis and pressing on your bladder.  You may develop or continue to have heartburn. This is caused by increased hormones that slow down muscles in the digestive tract.  You may develop or continue to have constipation because increased hormones slow digestion and cause the muscles that push waste through your intestines to relax.  You may develop hemorrhoids. These are swollen veins (varicose veins) in the rectum that can itch or be painful.  You may develop swollen, bulging veins (varicose veins) in your legs.  You may have increased body aches in the pelvis, back, or thighs. This is due to weight gain and increased hormones that are relaxing your joints.  You may have changes in your hair. These can include thickening of your hair, rapid growth, and changes in texture. Some women also have hair loss during or after pregnancy, or hair that feels dry or thin. Your hair will most likely return to normal after your baby is born.  Your breasts will continue to grow and they will continue to become tender. A yellow fluid (colostrum) may leak from your breasts. This is the first milk you are producing for your baby.  Your belly button may stick out.  You may notice more swelling in your hands,  face, or ankles.  You may have increased tingling or numbness in your hands, arms, and legs. The skin on your belly may also feel numb.  You may feel short of breath because of your expanding uterus.  You may have more problems sleeping. This can be caused by the size of your belly, increased need to urinate, and an increase in your body's metabolism.  You may notice the fetus "dropping," or moving lower in your abdomen (lightening).  You may have increased vaginal discharge.  You may notice your joints feel loose and you may have pain around your pelvic bone. What to expect at prenatal visits You will have prenatal exams every 2 weeks until week 36. Then you will have weekly prenatal exams. During a routine prenatal visit:  You will be weighed to make sure you and the baby are growing normally.  Your blood pressure will be taken.  Your abdomen will be measured to track your baby's growth.  The fetal heartbeat will be listened to.  Any test results from the previous visit will be discussed.  You may have a cervical check near your due date to see if your cervix has softened or thinned (effaced).  You will be tested for Group B streptococcus. This happens between 35 and 37 weeks. Your health care provider may ask you:  What your birth plan is.  How you are feeling.  If you are feeling the baby move.  If you have had any abnormal   symptoms, such as leaking fluid, bleeding, severe headaches, or abdominal cramping.  If you are using any tobacco products, including cigarettes, chewing tobacco, and electronic cigarettes.  If you have any questions. Other tests or screenings that may be performed during your third trimester include:  Blood tests that check for low iron levels (anemia).  Fetal testing to check the health, activity level, and growth of the fetus. Testing is done if you have certain medical conditions or if there are problems during the pregnancy.  Nonstress test  (NST). This test checks the health of your baby to make sure there are no signs of problems, such as the baby not getting enough oxygen. During this test, a belt is placed around your belly. The baby is made to move, and its heart rate is monitored during movement. What is false labor? False labor is a condition in which you feel small, irregular tightenings of the muscles in the womb (contractions) that usually go away with rest, changing position, or drinking water. These are called Braxton Hicks contractions. Contractions may last for hours, days, or even weeks before true labor sets in. If contractions come at regular intervals, become more frequent, increase in intensity, or become painful, you should see your health care provider. What are the signs of labor?  Abdominal cramps.  Regular contractions that start at 10 minutes apart and become stronger and more frequent with time.  Contractions that start on the top of the uterus and spread down to the lower abdomen and back.  Increased pelvic pressure and dull back pain.  A watery or bloody mucus discharge that comes from the vagina.  Leaking of amniotic fluid. This is also known as your "water breaking." It could be a slow trickle or a gush. Let your health care provider know if it has a color or strange odor. If you have any of these signs, call your health care provider right away, even if it is before your due date. Follow these instructions at home: Medicines  Follow your health care provider's instructions regarding medicine use. Specific medicines may be either safe or unsafe to take during pregnancy.  Take a prenatal vitamin that contains at least 600 micrograms (mcg) of folic acid.  If you develop constipation, try taking a stool softener if your health care provider approves. Eating and drinking   Eat a balanced diet that includes fresh fruits and vegetables, whole grains, good sources of protein such as meat, eggs, or tofu,  and low-fat dairy. Your health care provider will help you determine the amount of weight gain that is right for you.  Avoid raw meat and uncooked cheese. These carry germs that can cause birth defects in the baby.  If you have low calcium intake from food, talk to your health care provider about whether you should take a daily calcium supplement.  Eat four or five small meals rather than three large meals a day.  Limit foods that are high in fat and processed sugars, such as fried and sweet foods.  To prevent constipation: ? Drink enough fluid to keep your urine clear or pale yellow. ? Eat foods that are high in fiber, such as fresh fruits and vegetables, whole grains, and beans. Activity  Exercise only as directed by your health care provider. Most women can continue their usual exercise routine during pregnancy. Try to exercise for 30 minutes at least 5 days a week. Stop exercising if you experience uterine contractions.  Avoid heavy lifting.  Do   not exercise in extreme heat or humidity, or at high altitudes.  Wear low-heel, comfortable shoes.  Practice good posture.  You may continue to have sex unless your health care provider tells you otherwise. Relieving pain and discomfort  Take frequent breaks and rest with your legs elevated if you have leg cramps or low back pain.  Take warm sitz baths to soothe any pain or discomfort caused by hemorrhoids. Use hemorrhoid cream if your health care provider approves.  Wear a good support bra to prevent discomfort from breast tenderness.  If you develop varicose veins: ? Wear support pantyhose or compression stockings as told by your healthcare provider. ? Elevate your feet for 15 minutes, 3-4 times a day. Prenatal care  Write down your questions. Take them to your prenatal visits.  Keep all your prenatal visits as told by your health care provider. This is important. Safety  Wear your seat belt at all times when driving.  Make  a list of emergency phone numbers, including numbers for family, friends, the hospital, and police and fire departments. General instructions  Avoid cat litter boxes and soil used by cats. These carry germs that can cause birth defects in the baby. If you have a cat, ask someone to clean the litter box for you.  Do not travel far distances unless it is absolutely necessary and only with the approval of your health care provider.  Do not use hot tubs, steam rooms, or saunas.  Do not drink alcohol.  Do not use any products that contain nicotine or tobacco, such as cigarettes and e-cigarettes. If you need help quitting, ask your health care provider.  Do not use any medicinal herbs or unprescribed drugs. These chemicals affect the formation and growth of the baby.  Do not douche or use tampons or scented sanitary pads.  Do not cross your legs for long periods of time.  To prepare for the arrival of your baby: ? Take prenatal classes to understand, practice, and ask questions about labor and delivery. ? Make a trial run to the hospital. ? Visit the hospital and tour the maternity area. ? Arrange for maternity or paternity leave through employers. ? Arrange for family and friends to take care of pets while you are in the hospital. ? Purchase a rear-facing car seat and make sure you know how to install it in your car. ? Pack your hospital bag. ? Prepare the baby's nursery. Make sure to remove all pillows and stuffed animals from the baby's crib to prevent suffocation.  Visit your dentist if you have not gone during your pregnancy. Use a soft toothbrush to brush your teeth and be gentle when you floss. Contact a health care provider if:  You are unsure if you are in labor or if your water has broken.  You become dizzy.  You have mild pelvic cramps, pelvic pressure, or nagging pain in your abdominal area.  You have lower back pain.  You have persistent nausea, vomiting, or  diarrhea.  You have an unusual or bad smelling vaginal discharge.  You have pain when you urinate. Get help right away if:  Your water breaks before 37 weeks.  You have regular contractions less than 5 minutes apart before 37 weeks.  You have a fever.  You are leaking fluid from your vagina.  You have spotting or bleeding from your vagina.  You have severe abdominal pain or cramping.  You have rapid weight loss or weight gain.  You have   shortness of breath with chest pain.  You notice sudden or extreme swelling of your face, hands, ankles, feet, or legs.  Your baby makes fewer than 10 movements in 2 hours.  You have severe headaches that do not go away when you take medicine.  You have vision changes. Summary  The third trimester is from week 28 through week 40, months 7 through 9. The third trimester is a time when the unborn baby (fetus) is growing rapidly.  During the third trimester, your discomfort may increase as you and your baby continue to gain weight. You may have abdominal, leg, and back pain, sleeping problems, and an increased need to urinate.  During the third trimester your breasts will keep growing and they will continue to become tender. A yellow fluid (colostrum) may leak from your breasts. This is the first milk you are producing for your baby.  False labor is a condition in which you feel small, irregular tightenings of the muscles in the womb (contractions) that eventually go away. These are called Braxton Hicks contractions. Contractions may last for hours, days, or even weeks before true labor sets in.  Signs of labor can include: abdominal cramps; regular contractions that start at 10 minutes apart and become stronger and more frequent with time; watery or bloody mucus discharge that comes from the vagina; increased pelvic pressure and dull back pain; and leaking of amniotic fluid. This information is not intended to replace advice given to you by your  health care provider. Make sure you discuss any questions you have with your health care provider. Document Revised: 11/18/2018 Document Reviewed: 09/02/2016 Elsevier Patient Education  2020 [employer]  

## 2019-11-15 NOTE — Progress Notes (Signed)
Routine Prenatal Care Visit  Subjective  Dawn Richards is a 24 y.o. G1P0000 at [redacted]w[redacted]d being seen today for ongoing prenatal care.  She is currently monitored for the following issues for this low-risk pregnancy and has Female dyspareunia; Dysmenorrhea; Abnormal uterine bleeding (AUB); Adjustment disorder with mixed disturbance of emotions and conduct; NSAID overdose, intentional self-harm, sequela ([place]); Supervision of normal first pregnancy; Obesity affecting pregnancy; and BMI 36.0-36.9,adult on their problem list.  ----------------------------------------------------------------------------------- Patient reports seasonal allergies. Safe medications reviewed. She was unable to complete 3 hr gtt today due to nausea when drinking glucola. She will reschedule.   Contractions: Not present. Vag. Bleeding: None.  Movement: Present. Leaking Fluid denies.  ----------------------------------------------------------------------------------- The following portions of the patient's history were reviewed and updated as appropriate: allergies, current medications, past family history, past medical history, past social history, past surgical history and problem list. Problem list updated.  Objective  Blood pressure 120/76, weight 194 lb (88 kg), last menstrual period 04/16/2019. Pregravid weight 197 lb (89.4 kg) Total Weight Gain -3 lb (-1.361 kg) Urinalysis: Urine Protein    Urine Glucose    Fetal Status: Fetal Heart Rate (bpm): 140 Fundal Height: 29 cm Movement: Present     General:  Alert, oriented and cooperative. Patient is in no acute distress.  Skin: Skin is warm and dry. No rash noted.   Cardiovascular: Normal heart rate noted  Respiratory: Normal respiratory effort, no problems with respiration noted  Abdomen: Soft, gravid, appropriate for gestational age. Pain/Pressure: Absent     Pelvic:  Cervical exam deferred        Extremities: Normal range of motion.  Edema: None  Mental Status:  Normal mood and affect. Normal behavior. Normal judgment and thought content.   Assessment   24 y.o. G1P0000 at [redacted]w[redacted]d by  01/29/2020, by Ultrasound presenting for routine prenatal visit  Plan   pregnancy Problems (from 06/22/19 to present)    Problem Noted Resolved   Obesity affecting pregnancy 09/05/2019 by Will Bonnet, MD No   BMI 36.0-36.9,adult 09/05/2019 by Will Bonnet, MD No   Supervision of normal first pregnancy 06/22/2019 by Rod Can, CNM No   Overview Addendum 10/03/2019  9:30 AM by Homero Fellers, MD    Clinic [facility] Prenatal Labs  Dating  LMP Blood type: A/Positive/-- (12/21 1601)   Genetic Screen   NIPS:nml XX Antibody:Negative (12/21 1601)  Anatomic [place] complete Rubella: Nonimmune Varicella: ?  GTT Early:1Hr:158  3hr NML  Third trimester:  RPR: Non Reactive (12/21 1601)   Rhogam Not needed HBsAg: Negative (12/21 1601)   Vaccines TDAP:                       Flu Shot: 06/22/19 HIV: Non Reactive (12/21 1601)   Baby Food Breast                               GBS:   Contraception  Pap:06/2019 nml  CBB     CS/VBAC NA   Support Person Boyfriend [name]              Preterm labor symptoms and general obstetric precautions including but not limited to vaginal bleeding, contractions, leaking of fluid and fetal movement were reviewed in detail with the patient. Please refer to After Visit Summary for other counseling recommendations.   Return in about 2 weeks (around 11/29/2019) for rob and reschedule lab only 3hr gtt asap.  Tresea Mall, CNM 11/15/2019 9:49 AM

## 2019-11-16 DIAGNOSIS — Z3A28 28 weeks gestation of pregnancy: Secondary | ICD-10-CM | POA: Diagnosis not present

## 2019-11-16 DIAGNOSIS — M791 Myalgia, unspecified site: Secondary | ICD-10-CM | POA: Diagnosis not present

## 2019-11-16 DIAGNOSIS — R1030 Lower abdominal pain, unspecified: Secondary | ICD-10-CM | POA: Diagnosis not present

## 2019-11-16 DIAGNOSIS — R1110 Vomiting, unspecified: Secondary | ICD-10-CM | POA: Diagnosis not present

## 2019-11-16 DIAGNOSIS — Z20822 Contact with and (suspected) exposure to covid-19: Secondary | ICD-10-CM | POA: Diagnosis not present

## 2019-11-16 DIAGNOSIS — J302 Other seasonal allergic rhinitis: Secondary | ICD-10-CM | POA: Diagnosis not present

## 2019-11-16 DIAGNOSIS — J069 Acute upper respiratory infection, unspecified: Secondary | ICD-10-CM | POA: Diagnosis not present

## 2019-11-16 DIAGNOSIS — O99513 Diseases of the respiratory system complicating pregnancy, third trimester: Secondary | ICD-10-CM | POA: Diagnosis not present

## 2019-11-21 ENCOUNTER — Other Ambulatory Visit: Payer: Medicaid Other

## 2019-11-21 ENCOUNTER — Other Ambulatory Visit: Payer: Self-pay

## 2019-11-21 DIAGNOSIS — R7309 Other abnormal glucose: Secondary | ICD-10-CM | POA: Diagnosis not present

## 2019-11-22 LAB — GESTATIONAL GLUCOSE TOLERANCE
Glucose, Fasting: 81 mg/dL (ref 65–94)
Glucose, GTT - 1 Hour: 150 mg/dL (ref 65–179)
Glucose, GTT - 2 Hour: 107 mg/dL (ref 65–154)
Glucose, GTT - 3 Hour: 68 mg/dL (ref 65–139)

## 2019-11-22 NOTE — Progress Notes (Signed)
Please call patient with normal result.

## 2019-11-29 ENCOUNTER — Ambulatory Visit (INDEPENDENT_AMBULATORY_CARE_PROVIDER_SITE_OTHER): Payer: Medicaid Other | Admitting: Obstetrics and Gynecology

## 2019-11-29 ENCOUNTER — Other Ambulatory Visit: Payer: Self-pay

## 2019-11-29 ENCOUNTER — Encounter: Payer: Self-pay | Admitting: Obstetrics and Gynecology

## 2019-11-29 VITALS — BP 122/70 | Wt 199.0 lb

## 2019-11-29 DIAGNOSIS — Z3403 Encounter for supervision of normal first pregnancy, third trimester: Secondary | ICD-10-CM

## 2019-11-29 DIAGNOSIS — Z6836 Body mass index (BMI) 36.0-36.9, adult: Secondary | ICD-10-CM

## 2019-11-29 DIAGNOSIS — Z23 Encounter for immunization: Secondary | ICD-10-CM

## 2019-11-29 DIAGNOSIS — O99213 Obesity complicating pregnancy, third trimester: Secondary | ICD-10-CM

## 2019-11-29 DIAGNOSIS — Z3A31 31 weeks gestation of pregnancy: Secondary | ICD-10-CM

## 2019-11-29 NOTE — Addendum Note (Signed)
Addended by: Liliane Shi on: 11/29/2019 11:42 AM   Modules accepted: Orders

## 2019-11-29 NOTE — Progress Notes (Signed)
Routine Prenatal Care Visit  Subjective  Dawn Richards is a 24 y.o. G1P0000 at [redacted]w[redacted]d being seen today for ongoing prenatal care.  She is currently monitored for the following issues for this low-risk pregnancy and has Female dyspareunia; Dysmenorrhea; Abnormal uterine bleeding (AUB); Adjustment disorder with mixed disturbance of emotions and conduct; NSAID overdose, intentional self-harm, sequela ([place]); Supervision of normal first pregnancy; Obesity affecting pregnancy; and BMI 36.0-36.9,adult on their problem list.  ----------------------------------------------------------------------------------- Patient reports no complaints.   Contractions: Not present. Vag. Bleeding: None.  Movement: Present. Leaking Fluid denies.  ----------------------------------------------------------------------------------- The following portions of the patient's history were reviewed and updated as appropriate: allergies, current medications, past family history, past medical history, past social history, past surgical history and problem list. Problem list updated.  Objective  Blood pressure 122/70, weight 199 lb (90.3 kg), last menstrual period 04/16/2019. Pregravid weight 197 lb (89.4 kg) Total Weight Gain 2 lb (0.907 kg) Urinalysis: Urine Protein    Urine Glucose    Fetal Status: Fetal Heart Rate (bpm): 155 Fundal Height: 31 cm Movement: Present     General:  Alert, oriented and cooperative. Patient is in no acute distress.  Skin: Skin is warm and dry. No rash noted.   Cardiovascular: Normal heart rate noted  Respiratory: Normal respiratory effort, no problems with respiration noted  Abdomen: Soft, gravid, appropriate for gestational age. Pain/Pressure: Absent     Pelvic:  Cervical exam deferred        Extremities: Normal range of motion.  Edema: None  Mental Status: Normal mood and affect. Normal behavior. Normal judgment and thought content.   Assessment   24 y.o. G1P0000 at [redacted]w[redacted]d by   01/29/2020, by Ultrasound presenting for routine prenatal visit  Plan   pregnancy Problems (from 06/22/19 to present)    Problem Noted Resolved   Obesity affecting pregnancy 09/05/2019 by Will Bonnet, MD No   BMI 36.0-36.9,adult 09/05/2019 by Will Bonnet, MD No   Supervision of normal first pregnancy 06/22/2019 by Rod Can, CNM No   Overview Addendum 10/03/2019  9:30 AM by Homero Fellers, MD    [facility]inic [facility] Prenatal Labs  Dating  LMP Blood type: A/Positive/-- (12/21 1601)   Genetic Screen   NIPS:nml XX Antibody:Negative (12/21 1601)  Anatomic [place] complete Rubella: Nonimmune Varicella: ?  GTT Early:1Hr:158  3hr NML  Third trimester:  RPR: Non Reactive (12/21 1601)   Rhogam Not needed HBsAg: Negative (12/21 1601)   Vaccines TDAP:                       Flu Shot: 06/22/19 HIV: Non Reactive (12/21 1601)   Baby Food Breast                               GBS:   Contraception  Pap:06/2019 nml  CBB     CS/VBAC NA   Support Person Boyfriend [name]              Preterm labor symptoms and general obstetric precautions including but not limited to vaginal bleeding, contractions, leaking of fluid and fetal movement were reviewed in detail with the patient. Please refer to After Visit Summary for other counseling recommendations.   - TDaP today  The following were addressed during this visit:  Breastfeeding Education - Rooming-in on a 24-hour basis  - Feeding on demand or baby-led feeding  - Frequent feeding to help assure optimal milk  production  - Exclusive breastfeeding for the first 6 months    Return in about 2 weeks (around 12/13/2019) for Routine Prenatal Appointment.  Thomasene Mohair, MD, [facility], [facility] 11/29/2019 11:37 AM

## 2019-12-13 ENCOUNTER — Encounter: Payer: Medicaid Other | Admitting: Obstetrics and Gynecology

## 2019-12-13 DIAGNOSIS — Z3403 Encounter for supervision of normal first pregnancy, third trimester: Secondary | ICD-10-CM

## 2019-12-15 ENCOUNTER — Other Ambulatory Visit: Payer: Self-pay

## 2019-12-15 ENCOUNTER — Encounter: Payer: Self-pay | Admitting: Advanced Practice Midwife

## 2019-12-15 ENCOUNTER — Ambulatory Visit (INDEPENDENT_AMBULATORY_CARE_PROVIDER_SITE_OTHER): Payer: Medicaid Other | Admitting: Advanced Practice Midwife

## 2019-12-15 VITALS — BP 116/68 | Wt 201.0 lb

## 2019-12-15 DIAGNOSIS — Z3403 Encounter for supervision of normal first pregnancy, third trimester: Secondary | ICD-10-CM

## 2019-12-15 DIAGNOSIS — Z3A33 33 weeks gestation of pregnancy: Secondary | ICD-10-CM

## 2019-12-15 LAB — POCT URINALYSIS DIPSTICK OB
Glucose, UA: NEGATIVE
POC,PROTEIN,UA: NEGATIVE

## 2019-12-15 NOTE — Progress Notes (Signed)
Routine Prenatal Care Visit  Subjective  Dawn Richards Jerilee Hoh is a 24 y.o. G1P0000 at [redacted]w[redacted]d being seen today for ongoing prenatal care.  She is currently monitored for the following issues for this low-risk pregnancy and has Female dyspareunia; Dysmenorrhea; Abnormal uterine bleeding (AUB); Adjustment disorder with mixed disturbance of emotions and conduct; NSAID overdose, intentional self-harm, sequela ([place]); Supervision of normal first pregnancy; Obesity affecting pregnancy; and BMI 36.0-36.9,adult on their problem list.  ----------------------------------------------------------------------------------- Patient reports no complaints.  We discussed that she has done well with total weight gain and she admits drinking "a lot" of soda. She is encouraged to decrease sugar intake to avoid additional weight gain/macrosomic baby and is informed that if she gets to BMI 40 we will do additional testing beginning at 36 weeks.  Contractions: Not present. Vag. Bleeding: None.  Movement: Present. Leaking Fluid denies.  ----------------------------------------------------------------------------------- The following portions of the patient's history were reviewed and updated as appropriate: allergies, current medications, past family history, past medical history, past social history, past surgical history and problem list. Problem list updated.  Objective  Blood pressure 116/68, weight 201 lb (91.2 kg), last menstrual period 04/16/2019. Pregravid weight 197 lb (89.4 kg) Total Weight Gain 4 lb (1.814 kg) Urinalysis: Urine Protein Negative  Urine Glucose Negative  Fetal Status: Fetal Heart Rate (bpm): 152 Fundal Height: 33 cm Movement: Present     General:  Alert, oriented and cooperative. Patient is in no acute distress.  Skin: Skin is warm and dry. No rash noted.   Cardiovascular: Normal heart rate noted  Respiratory: Normal respiratory effort, no problems with respiration noted  Abdomen:  Soft, gravid, appropriate for gestational age. Pain/Pressure: Absent     Pelvic:  Cervical exam deferred        Extremities: Normal range of motion.  Edema: None  Mental Status: Normal mood and affect. Normal behavior. Normal judgment and thought content.   Assessment   24 y.o. G1P0000 at [redacted]w[redacted]d by  01/29/2020, by Ultrasound presenting for routine prenatal visit  Plan   pregnancy Problems (from 06/22/19 to present)    Problem Noted Resolved   Obesity affecting pregnancy 09/05/2019 by Will Bonnet, MD No   BMI 36.0-36.9,adult 09/05/2019 by Will Bonnet, MD No   Supervision of normal first pregnancy 06/22/2019 by Rod Can, CNM No   Overview Addendum 10/03/2019  9:30 AM by Homero Fellers, MD    Clinic [facility] Prenatal Labs  Dating  LMP Blood type: A/Positive/-- (12/21 1601)   Genetic Screen   NIPS:nml XX Antibody:Negative (12/21 1601)  Anatomic [place] complete Rubella: Nonimmune Varicella: ?  GTT Early:1Hr:158  3hr NML  Third trimester:  RPR: Non Reactive (12/21 1601)   Rhogam Not needed HBsAg: Negative (12/21 1601)   Vaccines TDAP:                       Flu Shot: 06/22/19 HIV: Non Reactive (12/21 1601)   Baby Food Breast                               GBS:   Contraception  Pap:06/2019 nml  CBB     CS/VBAC NA   Support Person Boyfriend [name]              Preterm labor symptoms and general obstetric precautions including but not limited to vaginal bleeding, contractions, leaking of fluid and fetal movement were reviewed in detail with the  patient.   Growth scan and APT as needed at 36 weeks for BMI greater than 40  Return in about 2 weeks (around 12/29/2019) for rob.  Tresea Mall, CNM 12/15/2019 8:31 AM

## 2019-12-15 NOTE — Lactation Note (Signed)
Lactation Consultation Note  Patient Name: Dawn Richards [id number] Date: 12/15/2019   Lactation student discussed benefits of breastfeeding per the [employer] curriculum. Dawn Richards encouraged to review breastfeeding information on Ready, set, [employer] site and given information for virtual breastfeeding classes.     Maternal Data    Feeding    LATCH Score                   Interventions    Lactation Tools Discussed/Used     Consult Status      Dawn Richards 12/15/2019, 8:19 AM

## 2019-12-29 ENCOUNTER — Other Ambulatory Visit: Payer: Self-pay

## 2019-12-29 ENCOUNTER — Encounter: Payer: Medicaid Other | Admitting: Advanced Practice Midwife

## 2019-12-29 ENCOUNTER — Encounter: Payer: Self-pay | Admitting: Advanced Practice Midwife

## 2019-12-29 ENCOUNTER — Ambulatory Visit (INDEPENDENT_AMBULATORY_CARE_PROVIDER_SITE_OTHER): Payer: Medicaid Other | Admitting: Advanced Practice Midwife

## 2019-12-29 VITALS — BP 122/74 | Wt 203.0 lb

## 2019-12-29 DIAGNOSIS — Z3403 Encounter for supervision of normal first pregnancy, third trimester: Secondary | ICD-10-CM

## 2019-12-29 DIAGNOSIS — Z3A35 35 weeks gestation of pregnancy: Secondary | ICD-10-CM

## 2019-12-29 NOTE — Progress Notes (Signed)
  Routine Prenatal Care Visit  Subjective  Dawn Richards is a 24 y.o. G1P0000 at [redacted]w[redacted]d being seen today for ongoing prenatal care.  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  ----------------------------------------------------------------------------------- Patient reports some discomfort in her hips especially at night when moving in bed.   Contractions: Not present. Vag. Bleeding: None.  Movement: Present. Leaking Fluid denies.  ----------------------------------------------------------------------------------- The following portions of the patient's history were reviewed and updated as appropriate: allergies, current medications, past family history, past medical history, past social history, past surgical history and problem list. Problem list updated.  Objective  Blood pressure 122/74, weight 203 lb (92.1 kg), last menstrual period 04/16/2019. Pregravid weight 197 lb (89.4 kg) Total Weight Gain 6 lb (2.722 kg) Urinalysis: Urine Protein    Urine Glucose    Fetal Status: Fetal Heart Rate (bpm): 156 Fundal Height: 35 cm Movement: Present     General:  Alert, oriented and cooperative. Patient is in no acute distress.  Skin: Skin is warm and dry. No rash noted.   Cardiovascular: Normal heart rate noted  Respiratory: Normal respiratory effort, no problems with respiration noted  Abdomen: Soft, gravid, appropriate for gestational age. Pain/Pressure: Absent     Pelvic:  Cervical exam deferred        Extremities: Normal range of motion.  Edema: None  Mental Status: Normal mood and affect. Normal behavior. Normal judgment and thought  content.   Assessment   24 y.o. G1P0000 at [redacted]w[redacted]d by  01/29/2020, by Ultrasound presenting for routine prenatal visit  Plan   pregnancy Problems (from 06/22/19 to present)    Problem Noted Resolved   Obesity affecting pregnancy 09/05/2019 by Conard Novak, MD No   BMI 36.0-36.9,adult 09/05/2019 by Conard Novak, MD No   Supervision of normal first pregnancy 06/22/2019 by Tresea Mall, CNM No   Overview Addendum 10/03/2019  9:30 AM by Natale Milch, MD    Clinic [facility] Prenatal Labs  Dating  LMP Blood type: A/Positive/-- (12/21 1601)   Genetic Screen   NIPS:nml XX Antibody:Negative (12/21 1601)  Anatomic [place] complete Rubella: Nonimmune Varicella: ?  GTT Early:1Hr:158  3hr NML  Third trimester:  RPR: Non Reactive (12/21 1601)   Rhogam Not needed HBsAg: Negative (12/21 1601)   Vaccines TDAP:                       Flu Shot: 06/22/19 HIV: Non Reactive (12/21 1601)   Baby Food Breast                               GBS:   Contraception  Pap:06/2019 nml  CBB     CS/VBAC NA   Support Person Boyfriend [name]              Preterm labor symptoms and general obstetric precautions including but not limited to vaginal bleeding, contractions, leaking of fluid and fetal movement were reviewed in detail with the patient.   Return in about 1 week (around 01/05/2020) for rob.  Tresea Mall, CNM 12/29/2019 12:08 PM

## 2019-12-29 NOTE — Progress Notes (Signed)
No vb. No lof. Some side pain.

## 2020-01-05 ENCOUNTER — Encounter: Payer: Medicaid Other | Admitting: Obstetrics and Gynecology

## 2020-01-06 ENCOUNTER — Encounter: Payer: Medicaid Other | Admitting: Advanced Practice Midwife

## 2020-01-12 ENCOUNTER — Encounter: Payer: Medicaid Other | Admitting: Advanced Practice Midwife

## 2020-01-17 ENCOUNTER — Ambulatory Visit: Payer: Medicaid Other | Admitting: Family Medicine

## 2020-01-17 ENCOUNTER — Encounter: Payer: Self-pay | Admitting: Family Medicine

## 2020-01-17 ENCOUNTER — Other Ambulatory Visit: Payer: Self-pay

## 2020-01-17 DIAGNOSIS — Z113 Encounter for screening for infections with a predominantly sexual mode of transmission: Secondary | ICD-10-CM

## 2020-01-17 DIAGNOSIS — A749 Chlamydial infection, unspecified: Secondary | ICD-10-CM | POA: Diagnosis not present

## 2020-01-17 LAB — WET PREP FOR TRICH, YEAST, CLUE
Trichomonas Exam: NEGATIVE
Yeast Exam: NEGATIVE

## 2020-01-17 MED ORDER — AZITHROMYCIN 500 MG PO TABS
1000.0000 mg | ORAL_TABLET | Freq: Once | ORAL | Status: AC
  Administered 2020-01-17: 1000 mg via ORAL

## 2020-01-17 NOTE — Progress Notes (Signed)
[facility] STI clinic/screening visit  Subjective:  Dawn Richards is a 24 y.o. female being seen today for an STI screening visit. The patient reports they do not have symptoms.  Patient reports that she is pregnant and due 02/05/2020.   They reported they are not interested in discussing contraception today.  Patient's last menstrual period was 04/16/2019 (exact date).   Patient has the following medical conditions:   Patient Active Problem List   Diagnosis Date Noted  . Obesity affecting pregnancy 09/05/2019  . BMI 36.0-36.9,adult 09/05/2019  . Supervision of normal first pregnancy 06/22/2019  . 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 NSAID overdose, intentional self-harm, sequela (HCC) 05/08/2017  . 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 Dysmenorrhea 08/13/2016  . Abnormal uterine bleeding (AUB) 08/13/2016    Chief Complaint  Patient presents with  . SEXUALLY TRANSMITTED DISEASE    HPI  Patient reports that she was informed by her partner that he had chlamydia.  She denies STD symptoms.  Client is receiving her PN care at [facility] and is due 02/05/2020.  She has a f/u appt 01/18/2020  Last HIV test per patient/review of record was 2020 Patient reports last pap was 2020.   See flowsheet for further details and programmatic requirements.    The following portions of the patient's history were reviewed and updated as appropriate: allergies, current medications, past medical history, past social history, past surgical history and problem list.  Objective:  There were no vitals filed for this visit.  Physical Exam Vitals and nursing note reviewed.  Constitutional:      Appearance: Normal appearance.  HENT:     Head: Normocephalic and atraumatic.     Mouth/Throat:     Mouth: Mucous membranes are moist.     Pharynx: Oropharynx is clear. No oropharyngeal exudate or posterior oropharyngeal erythema.   Pulmonary:     Effort: Pulmonary effort is normal.     Comments: pregnant Abdominal:     Palpations: There is no mass.     Tenderness: There is no abdominal tenderness. There is no rebound.  Genitourinary:    General: Normal vulva.     Exam position: Lithotomy position.     Pubic Area: No rash or pubic lice.      Labia:        Right: No rash or lesion.        Left: No rash or lesion.      Vagina: Normal. No vaginal discharge, erythema, bleeding or lesions.     Cervix: No cervical motion tenderness, discharge, friability, lesion or erythema.     Uterus: Normal.      Adnexa: Right adnexa normal and left adnexa normal.     Rectum: Normal.     Comments: Bimanual declined- client has PN appt 01/18/2020 Lymphadenopathy:     Head:     Right side of head: No preauricular or posterior auricular adenopathy.     Left side of head: No preauricular or posterior auricular adenopathy.     Cervical: No cervical adenopathy.     Upper Body:     Right upper body: No supraclavicular or axillary adenopathy.     Left upper body: No supraclavicular or axillary adenopathy.     Lower Body: No right inguinal adenopathy. No left inguinal adenopathy.  Skin:    General: Skin is warm and dry.     Findings: No rash.  Neurological:     Mental Status: She  is alert and oriented to person, place, and time.      Assessment and Plan:  Dawn Richards is a 24 y.o. female presenting to the [facility] for STI screening  1. Screening examination for venereal disease  - WET PREP FOR TRICH, YEAST, CLUE-negative results - Chlamydia/Gonorrhea [place] [facility] - HIV [place] [facility] - Syphilis Serology, [place] [facility]  2. Chlamydia-contact  - azithromycin (ZITHROMAX) tablet 1,000 mg Co. To abstain form sex x 1 week. Co to notify provider she has been treated.     No follow-ups on file.  Future Appointments  Date Time Provider [place]  01/18/2020  4:30 PM Will Bonnet, MD [facility] None    Hassell Done, FNP

## 2020-01-17 NOTE — Progress Notes (Signed)
Patient treated as a contact to Chlamydia per standing orders. Tawny Hopping, RN

## 2020-01-18 ENCOUNTER — Ambulatory Visit (INDEPENDENT_AMBULATORY_CARE_PROVIDER_SITE_OTHER): Payer: Medicaid Other | Admitting: Obstetrics and Gynecology

## 2020-01-18 ENCOUNTER — Other Ambulatory Visit (HOSPITAL_COMMUNITY)
Admission: RE | Admit: 2020-01-18 | Discharge: 2020-01-18 | Disposition: A | Discharge status: Home / Self Care | Payer: Medicaid Other | Source: Ambulatory Visit | Attending: Obstetrics and Gynecology | Admitting: Obstetrics and Gynecology

## 2020-01-18 ENCOUNTER — Encounter: Payer: Self-pay | Admitting: Obstetrics and Gynecology

## 2020-01-18 VITALS — BP 131/84 | Wt 211.0 lb

## 2020-01-18 DIAGNOSIS — Z3A38 38 weeks gestation of pregnancy: Secondary | ICD-10-CM

## 2020-01-18 DIAGNOSIS — O99213 Obesity complicating pregnancy, third trimester: Secondary | ICD-10-CM

## 2020-01-18 DIAGNOSIS — O0990 Supervision of high risk pregnancy, unspecified, unspecified trimester: Secondary | ICD-10-CM | POA: Diagnosis not present

## 2020-01-18 NOTE — Progress Notes (Signed)
  Routine Prenatal Care Visit  Subjective  Dawn Richards [name] is a 24 y.o. G1P0000 at [redacted]w[redacted]d being seen today for ongoing prenatal care.  She is currently monitored for the following issues for this high-risk pregnancy and has Female dyspareunia; Dysmenorrhea; Abnormal uterine bleeding (AUB); Adjustment disorder with mixed disturbance of emotions and conduct; NSAID overdose, intentional self-harm, sequela (HCC); Supervision of high risk pregnancy, antepartum; Obesity affecting pregnancy; and BMI 36.0-36.9,adult on their problem list.  ----------------------------------------------------------------------------------- Patient reports no complaints.   Contractions: Not present. Vag. Bleeding: None.  Movement: Present. Leaking Fluid denies.  ----------------------------------------------------------------------------------- The following portions of the patient's history were reviewed and updated as appropriate: allergies, current medications, past family history, past medical history, past social history, past surgical history and problem list. Problem list updated.  Objective  Blood pressure 131/84, weight 211 lb (95.7 kg), last menstrual period 04/16/2019. Pregravid weight 197 lb (89.4 kg) Total Weight Gain 14 lb (6.35 kg) Urinalysis: Urine Protein    Urine Glucose    Fetal Status: Fetal Heart Rate (bpm): 140 Fundal Height: 37 cm Movement: Present  Presentation: Vertex  General:  Alert, oriented and cooperative. Patient is in no acute distress.  Skin: Skin is warm and dry. No rash noted.   Cardiovascular: Normal heart rate noted  Respiratory: Normal respiratory effort, no problems with respiration noted  Abdomen: Soft, gravid, appropriate for gestational age. Pain/Pressure: Absent     Pelvic:  Cervical exam performed Dilation: Closed Effacement (%): 40 Station: -3  Extremities: Normal range of motion.  Edema: None  Mental Status: Normal mood and affect. Normal behavior. Normal  judgment and thought content.   Assessment   24 y.o. G1P0000 at [redacted]w[redacted]d by  01/29/2020, by Ultrasound presenting for routine prenatal visit  Plan   pregnancy Problems (from 06/22/19 to present)    Problem Noted Resolved   Obesity affecting pregnancy 09/05/2019 by Conard Novak, MD No   BMI 36.0-36.9,adult 09/05/2019 by Conard Novak, MD No   Supervision of high risk pregnancy, antepartum 06/22/2019 by Tresea Mall, CNM No   Overview Addendum 10/03/2019  9:30 AM by Natale Milch, MD    [facility]inic [facility] Prenatal Labs  Dating  LMP Blood type: A/Positive/-- (12/21 1601)   Genetic Screen   NIPS:nml XX Antibody:Negative (12/21 1601)  Anatomic [place] complete Rubella: Nonimmune Varicella: ?  GTT Early:1Hr:158  3hr NML  Third trimester:  RPR: Non Reactive (12/21 1601)   Rhogam Not needed HBsAg: Negative (12/21 1601)   Vaccines TDAP:                       Flu Shot: 06/22/19 HIV: Non Reactive (12/21 1601)   Baby Food Breast                               GBS:   Contraception  Pap:06/2019 nml  CBB     CS/VBAC NA   Support Person Boyfriend [name]              Term labor symptoms and general obstetric precautions including but not limited to vaginal bleeding, contractions, leaking of fluid and fetal movement were reviewed in detail with the patient. Please refer to After Visit Summary for other counseling recommendations.   Return in about 1 week (around 01/25/2020) for U/S for AFI and Routine Prenatal Appointment/NST.  Thomasene Mohair, MD, [facility], [facility] 01/18/2020 5:48 PM

## 2020-01-20 LAB — CERVICOVAGINAL ANCILLARY ONLY
Chlamydia: NEGATIVE
Comment: NEGATIVE
Comment: NEGATIVE
Comment: NORMAL
Neisseria Gonorrhea: NEGATIVE
Trichomonas: NEGATIVE

## 2020-01-21 LAB — STREP GP B NAA: Strep Gp B NAA: NEGATIVE

## 2020-01-25 ENCOUNTER — Encounter: Payer: Self-pay | Admitting: Obstetrics & Gynecology

## 2020-01-25 ENCOUNTER — Other Ambulatory Visit: Payer: Self-pay

## 2020-01-25 ENCOUNTER — Observation Stay
Admission: EM | Admit: 2020-01-25 | Discharge: 2020-01-25 | Disposition: A | Discharge status: Home / Self Care | Payer: Medicaid Other | Attending: Certified Nurse Midwife | Admitting: Certified Nurse Midwife

## 2020-01-25 DIAGNOSIS — R030 Elevated blood-pressure reading, without diagnosis of hypertension: Secondary | ICD-10-CM | POA: Diagnosis not present

## 2020-01-25 DIAGNOSIS — Z3A39 39 weeks gestation of pregnancy: Secondary | ICD-10-CM | POA: Insufficient documentation

## 2020-01-25 DIAGNOSIS — O0990 Supervision of high risk pregnancy, unspecified, unspecified trimester: Secondary | ICD-10-CM

## 2020-01-25 DIAGNOSIS — Z6836 Body mass index (BMI) 36.0-36.9, adult: Secondary | ICD-10-CM

## 2020-01-25 DIAGNOSIS — O99213 Obesity complicating pregnancy, third trimester: Secondary | ICD-10-CM

## 2020-01-25 DIAGNOSIS — O471 False labor at or after 37 completed weeks of gestation: Principal | ICD-10-CM | POA: Insufficient documentation

## 2020-01-25 LAB — COMPREHENSIVE METABOLIC PANEL
ALT: 8 U/L (ref 0–44)
AST: 13 U/L — ABNORMAL LOW (ref 15–41)
Albumin: 2.5 g/dL — ABNORMAL LOW (ref 3.5–5.0)
Alkaline Phosphatase: 137 U/L — ABNORMAL HIGH (ref 38–126)
Anion gap: 7 (ref 5–15)
BUN: 8 mg/dL (ref 6–20)
CO2: 21 mmol/L — ABNORMAL LOW (ref 22–32)
Calcium: 8.3 mg/dL — ABNORMAL LOW (ref 8.9–10.3)
Chloride: 108 mmol/L (ref 98–111)
Creatinine, Ser: 0.39 mg/dL — ABNORMAL LOW (ref 0.44–1.00)
GFR calc Af Amer: >60 mL/min (ref >60)
GFR calc non Af Amer: >60 mL/min (ref >60)
Glucose, Bld: 75 mg/dL (ref 70–99)
Potassium: 3.6 mmol/L (ref 3.5–5.1)
Sodium: 136 mmol/L (ref 135–145)
Total Bilirubin: 0.5 mg/dL (ref 0.3–1.2)
Total Protein: 6 g/dL — ABNORMAL LOW (ref 6.5–8.1)

## 2020-01-25 LAB — CBC
HCT: 30.4 % — ABNORMAL LOW (ref 36.0–46.0)
Hemoglobin: 10.1 g/dL — ABNORMAL LOW (ref 12.0–15.0)
MCH: 28.8 pg (ref 26.0–34.0)
MCHC: 33.2 g/dL (ref 30.0–36.0)
MCV: 86.6 fL (ref 80.0–100.0)
Platelets: 223 10*3/uL (ref 150–400)
RBC: 3.51 MIL/uL — ABNORMAL LOW (ref 3.87–5.11)
RDW: 13.2 % (ref 11.5–15.5)
WBC: 9.5 10*3/uL (ref 4.0–10.5)
nRBC: 0 % (ref 0.0–0.2)

## 2020-01-25 LAB — PROTEIN / CREATININE RATIO, URINE
Creatinine, Urine: 136 mg/dL
Protein Creatinine Ratio: 0.13 mg/mg{Cre} (ref 0.00–0.15)
Total Protein, Urine: 17 mg/dL

## 2020-01-25 MED ORDER — PRENATAL VITAMINS 28-0.8 MG PO TABS: 1.0000 | ORAL_TABLET | Freq: Every day | ORAL | 11 refills | Status: DC

## 2020-01-25 NOTE — Final Progress Note (Signed)
Physician Final Progress Note  Patient ID: Haleemah Buckalew MRN: [id number] DOB/AGE: 1995-11-19 24 y.o.  Admit date: 01/25/2020 Admitting provider: Renae Fickle Harris/ Shant Hence L. Brina Umeda Discharge date: 01/25/2020   Admission Diagnoses: IUP at 39wk3d with contractions Elevated blood pressure Discharge Diagnoses:  IUP at 39wk3d with false labor Labile blood pressures  Consults: None  Significant Findings/ Diagnostic Studies:  Chief Complaint: Complains of strong contractions every 2-3 minutes since 0500 this AM.  HPI:  Dawn Richards is a 25 y.o. G47P0000 female with EDC=6/20/2021at [redacted]w[redacted]d dated by a 9wk4d ultrasound.  Her pregnancy has been complicated by elevated glucola with normal 3hr GTT x 2 and obesity with current BMI 41.21 kg/m2. She had a 14# weight gain with the pregnancy.  She presents to L&D for evaluation of contractions/labor. She states she woke up with frequent painful contractions at 0500. She rates the pain at a 9/10. Denies vaginal bleeding and leakage of fluid. Baby has been active. Denies recent sexual intercourse. Was moving a lot of furniture yesterday.    Prenatal care site: Prenatal care at [facility] has been remarkable for  [facility]inic [facility] Prenatal Labs  Dating  LMP Blood type: A/Positive/-- (12/21 1601)   Genetic Screen   NIPS:nml XX Antibody:Negative (12/21 1601)  Anatomic [place] complete Rubella: Nonimmune Varicella: ?  GTT Early:1Hr:158  3hr NML  Third trimester:  RPR: Non Reactive (12/21 1601)   Rhogam Not needed HBsAg: Negative (12/21 1601)   Vaccines TDAP:                       Flu Shot: 06/22/19 HIV: Non Reactive (12/21 1601)   Baby Food Breast                               GBS:   Contraception  Pap:06/2019 nml  CBB     CS/VBAC NA   Support Person Boyfriend [name]         Maternal Medical History:   Past Medical History:  Diagnosis Date  . DUB (dysfunctional uterine bleeding)   . Pelvic pain     Past Surgical  History:  Procedure Laterality Date  . NO PAST SURGERIES      No Known Allergies  Prior to Admission medications   Medication Sig Start Date End Date Taking? Authorizing Provider  Prenatal Vit-Fe Fumarate-FA (PRENATAL VITAMINS) 28-0.8 MG TABS Take 1 tablet by mouth daily. 01/25/20   Farrel Conners, CNM      Social History: She  reports that she has quit smoking. Her smoking use included cigarettes. She has never used smokeless tobacco. She reports current alcohol use. She reports previous drug use. Drug: Marijuana.  Family History: family history is not on file.   Review of Systems: Review of Systems  Constitutional: Negative for fever.  HENT: Positive for congestion.   Cardiovascular: Positive for leg swelling. Negative for chest pain.  Gastrointestinal: Positive for abdominal pain. Negative for nausea and vomiting.  Genitourinary: Negative for dysuria.  Neurological: Negative for headaches.  Endo/Heme/Allergies: Positive for environmental allergies.      Physical Exam:  Vital Signs: BP 136/79 (BP Location: Right Arm)   Pulse 79   Temp 98 F (36.7 C) (Oral)   Resp 17   Ht 5' (1.524 m)   Wt 95.7 kg   LMP 04/16/2019 (Exact Date)   BMI 41.21 kg/m   Patient Vitals for the past 24 hrs:  BP  Temp Temp src Pulse Resp Height Weight  01/25/20 1130 136/79 -- -- 79 -- -- --  01/25/20 1019 (!) 115/93 -- -- (!) 182 -- -- --  01/25/20 1004 133/76 -- -- 66 -- -- --  01/25/20 0949 123/74 -- -- 63 -- -- --  01/25/20 0936 122/79 98 F (36.7 C) Oral 67 17 -- --  01/25/20 0903 131/84 -- -- 68 -- -- --  01/25/20 0848 123/79 -- -- (!) 55 -- -- --  01/25/20 0833 126/76 -- -- 69 -- -- --  01/25/20 0818 130/80 -- -- 68 -- -- --  01/25/20 0811 (!) 120/91 -- -- 71 -- -- --  01/25/20 0809 (!) 120/91 98 F (36.7 C) Oral 71 18 5' (1.524 m) 95.7 kg  Note: at 1019 patient was lying on her blood pressure cuff.   General:initially was breathing through contractions, but appears comfortable  since the contractions have spaced out HEENT: normocephalic, atraumatic Heart: regular rate & rhythm.  No murmurs/rubs/gallops Lungs: clear to auscultation bilaterally Abdomen: soft, gravid, tender with contractions;   Pelvic:   External: Normal external female genitalia  Cervix: closed/long/-2 Extremities: non-tender, symmetric, +1 to +2 pedal and ankle edema bilaterally.   Neurologic: Alert & oriented x 3.   Baseline FHR: baseline 130 with accelerations to 150s to 160s, moderate variability Toco: mostly uterine irritability, but occasional contractions that spaced out very quickly with po hydration.  Results for orders placed or performed during the hospital encounter of 01/25/20 (from the past 24 hour(s))  CBC     Status: Abnormal   Collection Time: 01/25/20  9:37 AM  Result Value Ref Range   WBC 9.5 4.0 - 10.5 K/uL   RBC 3.51 (L) 3.87 - 5.11 MIL/uL   Hemoglobin 10.1 (L) 12.0 - 15.0 g/dL   HCT 81.8 (L) 36 - 46 %   MCV 86.6 80.0 - 100.0 fL   MCH 28.8 26.0 - 34.0 pg   MCHC 33.2 30.0 - 36.0 g/dL   RDW 29.9 37.1 - 69.6 %   Platelets 223 150 - 400 K/uL   nRBC 0.0 0.0 - 0.2 %  Comprehensive metabolic panel     Status: Abnormal   Collection Time: 01/25/20  9:37 AM  Result Value Ref Range   Sodium 136 135 - 145 mmol/L   Potassium 3.6 3.5 - 5.1 mmol/L   Chloride 108 98 - 111 mmol/L   CO2 21 (L) 22 - 32 mmol/L   Glucose, Bld 75 70 - 99 mg/dL   BUN 8 6 - 20 mg/dL   Creatinine, Ser 7.89 (L) 0.44 - 1.00 mg/dL   Calcium 8.3 (L) 8.9 - 10.3 mg/dL   Total Protein 6.0 (L) 6.5 - 8.1 g/dL   Albumin 2.5 (L) 3.5 - 5.0 g/dL   AST 13 (L) 15 - 41 U/L   ALT 8 0 - 44 U/L   Alkaline Phosphatase 137 (H) 38 - 126 U/L   Total Bilirubin 0.5 0.3 - 1.2 mg/dL   GFR calc non Af Amer >60 >60 mL/min   GFR calc Af Amer >60 >60 mL/min   Anion gap 7 5 - 15  Protein / creatinine ratio, urine     Status: None   Collection Time: 01/25/20 10:20 AM  Result Value Ref Range   Creatinine, Urine 136 mg/dL    Total Protein, Urine 17 mg/dL   Protein Creatinine Ratio 0.13 0.00 - 0.15 mg/mg[Cre]   A: False labor at 39wk3d Had a few elevated diastolic blood  pressures, but normal PC ratio and HELLP labs and she was asymptomatic Mild anemia  P: Discharge home with labor precautions Encouraged to take prenatal vitamins. A RX for vitamins sent to pharmacy Follow up blood pressure check tomorrow in office    Procedures: none  Discharge Condition: stable  Disposition: Discharge disposition: 01-Home or Self Care       Diet: Regular diet  Discharge Activity: Activity as tolerated  Discharge Instructions    Discharge patient   Complete by: As directed    Discharge disposition: 01-Home or Self Care   Discharge patient date: 01/25/2020     Allergies as of 01/25/2020   No Known Allergies     Medication List    TAKE these medications   PRENATAL PO Take by mouth daily.        Total time spent taking care of this patient: 30 minutes  Signed: Dalia Heading 01/25/2020, 11:39 AM

## 2020-01-25 NOTE — OB Triage Note (Signed)
Patient states she started contracting at 0500 this morning. She states they are currently every 2-3 minutes apart and reports pain 9/10. Reports positive fetal movement. Pt denies leaking of fluid or vaginal bleeding. Vital signs stable. Will continue to monitor.

## 2020-01-26 ENCOUNTER — Encounter: Payer: Medicaid Other | Admitting: Advanced Practice Midwife

## 2020-01-26 ENCOUNTER — Ambulatory Visit: Payer: Medicaid Other

## 2020-01-30 ENCOUNTER — Encounter: Payer: Self-pay | Admitting: Obstetrics and Gynecology

## 2020-01-30 ENCOUNTER — Ambulatory Visit (INDEPENDENT_AMBULATORY_CARE_PROVIDER_SITE_OTHER): Payer: Medicaid Other

## 2020-01-30 ENCOUNTER — Other Ambulatory Visit: Payer: Self-pay | Admitting: Obstetrics and Gynecology

## 2020-01-30 ENCOUNTER — Inpatient Hospital Stay
Admit: 2020-01-30 | Discharge: 2020-02-01 | DRG: 807 | Disposition: A | Discharge status: Home / Self Care | Payer: Medicaid Other | Attending: Advanced Practice Midwife | Admitting: Advanced Practice Midwife

## 2020-01-30 ENCOUNTER — Other Ambulatory Visit: Payer: Self-pay

## 2020-01-30 ENCOUNTER — Ambulatory Visit (INDEPENDENT_AMBULATORY_CARE_PROVIDER_SITE_OTHER): Payer: Medicaid Other | Admitting: Obstetrics and Gynecology

## 2020-01-30 ENCOUNTER — Encounter: Payer: Self-pay | Admitting: Advanced Practice Midwife

## 2020-01-30 VITALS — BP 120/72 | Wt 214.8 lb

## 2020-01-30 DIAGNOSIS — O0990 Supervision of high risk pregnancy, unspecified, unspecified trimester: Secondary | ICD-10-CM

## 2020-01-30 DIAGNOSIS — Z3A40 40 weeks gestation of pregnancy: Secondary | ICD-10-CM

## 2020-01-30 DIAGNOSIS — O99344 Other mental disorders complicating childbirth: Secondary | ICD-10-CM | POA: Diagnosis present

## 2020-01-30 DIAGNOSIS — O99213 Obesity complicating pregnancy, third trimester: Secondary | ICD-10-CM | POA: Diagnosis not present

## 2020-01-30 DIAGNOSIS — O4103X0 Oligohydramnios, third trimester, not applicable or unspecified: Principal | ICD-10-CM | POA: Diagnosis present

## 2020-01-30 DIAGNOSIS — Z20822 Contact with and (suspected) exposure to covid-19: Secondary | ICD-10-CM | POA: Diagnosis present

## 2020-01-30 DIAGNOSIS — Z87891 Personal history of nicotine dependence: Secondary | ICD-10-CM

## 2020-01-30 DIAGNOSIS — Z6836 Body mass index (BMI) 36.0-36.9, adult: Secondary | ICD-10-CM

## 2020-01-30 DIAGNOSIS — F4325 Adjustment disorder with mixed disturbance of emotions and conduct: Secondary | ICD-10-CM | POA: Diagnosis present

## 2020-01-30 DIAGNOSIS — O99214 Obesity complicating childbirth: Secondary | ICD-10-CM | POA: Diagnosis present

## 2020-01-30 LAB — CBC
HCT: 31.7 % — ABNORMAL LOW (ref 36.0–46.0)
Hemoglobin: 10.5 g/dL — ABNORMAL LOW (ref 12.0–15.0)
MCH: 28.9 pg (ref 26.0–34.0)
MCHC: 33.1 g/dL (ref 30.0–36.0)
MCV: 87.3 fL (ref 80.0–100.0)
Platelets: 238 10*3/uL (ref 150–400)
RBC: 3.63 MIL/uL — ABNORMAL LOW (ref 3.87–5.11)
RDW: 13.2 % (ref 11.5–15.5)
WBC: 9.1 10*3/uL (ref 4.0–10.5)
nRBC: 0 % (ref 0.0–0.2)

## 2020-01-30 LAB — TYPE AND SCREEN
ABO/RH(D): A POS
Antibody Screen: NEGATIVE

## 2020-01-30 LAB — SARS CORONAVIRUS 2 BY RT PCR (HOSPITAL ORDER, PERFORMED IN ~~LOC~~ HOSPITAL LAB): SARS Coronavirus 2: NEGATIVE

## 2020-01-30 LAB — ABO/RH: ABO/RH(D): A POS

## 2020-01-30 MED ORDER — ONDANSETRON HCL 4 MG/2ML IJ SOLN: 4.0000 mg | Freq: Four times a day (QID) | INTRAMUSCULAR | Status: DC | PRN

## 2020-01-30 MED ORDER — OXYTOCIN-SODIUM CHLORIDE 30-0.9 UT/500ML-% IV SOLN
1.0000 m[IU]/min | INTRAVENOUS | Status: DC
  Administered 2020-01-31: 1 m[IU]/min via INTRAVENOUS
  Filled 2020-01-30 (×2): qty 500

## 2020-01-30 MED ORDER — LACTATED RINGERS IV SOLN: INTRAVENOUS | Status: DC

## 2020-01-30 MED ORDER — BUTORPHANOL TARTRATE 1 MG/ML IJ SOLN
1.0000 mg | INTRAMUSCULAR | Status: DC | PRN
  Administered 2020-01-30 – 2020-01-31 (×4): 1 mg via INTRAVENOUS
  Filled 2020-01-30 (×4): qty 1

## 2020-01-30 MED ORDER — OXYTOCIN-SODIUM CHLORIDE 30-0.9 UT/500ML-% IV SOLN
2.5000 [IU]/h | INTRAVENOUS | Status: DC
  Administered 2020-01-31: 2.5 [IU]/h via INTRAVENOUS

## 2020-01-30 MED ORDER — LIDOCAINE HCL (PF) 1 % IJ SOLN
30.0000 mL | INTRAMUSCULAR | Status: AC | PRN
  Administered 2020-01-31: 30 mL via SUBCUTANEOUS

## 2020-01-30 MED ORDER — MISOPROSTOL 100 MCG PO TABS
25.0000 ug | ORAL_TABLET | ORAL | Status: DC | PRN
  Administered 2020-01-30 (×2): 25 ug via VAGINAL
  Filled 2020-01-30 (×3): qty 1

## 2020-01-30 MED ORDER — ACETAMINOPHEN 325 MG PO TABS
650.0000 mg | ORAL_TABLET | ORAL | Status: DC | PRN
  Filled 2020-01-30: qty 2

## 2020-01-30 MED ORDER — TERBUTALINE SULFATE 1 MG/ML IJ SOLN: 0.2500 mg | Freq: Once | INTRAMUSCULAR | Status: DC | PRN

## 2020-01-30 MED ORDER — OXYTOCIN BOLUS FROM INFUSION
333.0000 mL | Freq: Once | INTRAVENOUS | Status: AC
  Administered 2020-01-31: 333 mL via INTRAVENOUS

## 2020-01-30 MED ORDER — LACTATED RINGERS IV SOLN
500.0000 mL | INTRAVENOUS | Status: DC | PRN
  Administered 2020-01-31: 500 mL via INTRAVENOUS

## 2020-01-30 NOTE — Progress Notes (Addendum)
  Labor Progress Note   24 y.o. G1P0000 @ [redacted]w[redacted]d , admitted for  Pregnancy, Labor Management.   Subjective:  Feeling contractions and breathing through them. She accepts IV pain medication at this time.  Objective:  BP (!) 150/93 (BP Location: Left Arm)   Pulse 70   Temp 98.4 F (36.9 C) (Oral)   Resp 19   Ht 5' (1.524 m)   Wt 97.1 kg   LMP 04/16/2019 (Exact Date)   BMI 41.79 kg/m  Abd: gravid, ND, FHT present, mild tenderness on exam Extr: trace to 1+ bilateral pedal edema SVE: CERVIX: 2.5 cm dilated, 70 effaced, -1 station  EFM: FHR: 140 bpm, variability: moderate,  accelerations:  Present,  decelerations:  Absent Toco: Frequency: Every 1-3 minutes Labs: I have reviewed the patient's lab results.   Assessment & Plan:  G1P0000 @ [redacted]w[redacted]d, admitted for  Pregnancy and Labor/Delivery Management  1. Pain management: IV analgesia. 2. FWB: FHT category I.  3. ID: GBS negative 4. Labor management: start and titrate pitocin as needed when contraction pattern warrants  All discussed with patient, see orders   Tresea Mall, CNM [facility] 01/30/2020  10:18 PM

## 2020-01-30 NOTE — Progress Notes (Signed)
  Labor Progress Note   24 y.o. G1P0000 @ [redacted]w[redacted]d , admitted for  Pregnancy, Labor Management.   Subjective:  Beginning to feel some contractions  Objective:  BP 134/76 (BP Location: Left Arm)   Pulse 76   Temp (!) 97.3 F (36.3 C) (Oral)   Resp 16   Ht 5' (1.524 m)   Wt 97.1 kg   LMP 04/16/2019 (Exact Date)   BMI 41.79 kg/m  Abd: gravid, ND, FHT present, mild tenderness on exam Extr: trace to 1+ bilateral pedal edema SVE: CERVIX: 1.5 cm dilated, 50-60 effaced, -1 station Able to pull posterior cervix forward and sweep done  EFM: FHR: 125 bpm, variability: moderate,  accelerations:  Present,  decelerations:  Absent Toco: Frequency: Every 5-6 minutes Labs: I have reviewed the patient's lab results.   Assessment & Plan:  G1P0000 @ [redacted]w[redacted]d, admitted for  Pregnancy and Labor/Delivery Management  1. Pain management: none. 2. FWB: FHT category I.  3. ID: GBS negative 4. Labor management: second dose of cytotec placed  All discussed with patient, see orders   Tresea Mall, CNM [facility] 01/30/2020  5:19 PM

## 2020-01-30 NOTE — Progress Notes (Signed)
Routine Prenatal Care Visit  Subjective  Dawn Richards [name] is a 24 y.o. G1P0000 at [redacted]w[redacted]d being seen today for ongoing prenatal care.  She is currently monitored for the following issues for this high-risk pregnancy and has Female dyspareunia; Dysmenorrhea; Abnormal uterine bleeding (AUB); Adjustment disorder with mixed disturbance of emotions and conduct; NSAID overdose, intentional self-harm, sequela (HCC); Supervision of high risk pregnancy, antepartum; Obesity affecting pregnancy; BMI 36.0-36.9,adult; and Indication for care in labor and delivery, antepartum on their problem list.  ----------------------------------------------------------------------------------- Patient reports no complaints.   Contractions: Not present. Vag. Bleeding: None.  Movement: Present. Denies leaking of fluid.  ----------------------------------------------------------------------------------- The following portions of the patient's history were reviewed and updated as appropriate: allergies, current medications, past family history, past medical history, past social history, past surgical history and problem list. Problem list updated.   Objective  Blood pressure 120/72, weight 214 lb 12.8 oz (97.4 kg), last menstrual period 04/16/2019. Pregravid weight 197 lb (89.4 kg) Total Weight Gain 17 lb 12.8 oz (8.074 kg) Urinalysis:      Fetal Status:     Movement: Present     General:  Alert, oriented and cooperative. Patient is in no acute distress.  Skin: Skin is warm and dry. No rash noted.   Cardiovascular: Normal heart rate noted  Respiratory: Normal respiratory effort, no problems with respiration noted  Abdomen: Soft, gravid, appropriate for gestational age. Pain/Pressure: Absent     Pelvic:  Cervical exam deferred        Extremities: Normal range of motion.     Mental Status: Normal mood and affect. Normal behavior. Normal judgment and thought content.     Assessment   24 y.o. G1P0000  at [redacted]w[redacted]d by  01/29/2020, by Ultrasound presenting for routine prenatal visit  Plan   pregnancy Problems (from 06/22/19 to present)    Problem Noted Resolved   Obesity affecting pregnancy 09/05/2019 by Conard Novak, MD No   BMI 36.0-36.9,adult 09/05/2019 by Conard Novak, MD No   Supervision of high risk pregnancy, antepartum 06/22/2019 by Tresea Mall, CNM No   Overview Addendum 10/03/2019  9:30 AM by Natale Milch, MD    [facility]inic [facility] Prenatal Labs  Dating  LMP Blood type: A/Positive/-- (12/21 1601)   Genetic Screen   NIPS:nml XX Antibody:Negative (12/21 1601)  Anatomic [place] complete Rubella: Nonimmune Varicella: ?  GTT Early:1Hr:158  3hr NML  Third trimester:  RPR: Non Reactive (12/21 1601)   Rhogam Not needed HBsAg: Negative (12/21 1601)   Vaccines TDAP:                       Flu Shot: 06/22/19 HIV: Non Reactive (12/21 1601)   Baby Food Breast                               GBS:   Contraception  Pap:06/2019 nml  CBB     CS/VBAC NA   Support Person Boyfriend [name]          Previous Version      Oligohydramnios- sent to hospital for IOL   Gestational age appropriate obstetric precautions including but not limited to vaginal bleeding, contractions, leaking of fluid and fetal movement were reviewed in detail with the patient.    Return in about 1 week (around 02/06/2020) for ROB in person.  Natale Milch MD [facility], [facility] 01/30/2020, 9:07 AM

## 2020-01-30 NOTE — H&P (Addendum)
OB History & Physical   History of Present Illness:  Chief Complaint: low amniotic fluid in clinic  HPI:  Dawn Richards is a 24 y.o. G1P0000 female at [redacted]w[redacted]d dated by 9 week ultrasound.  Her pregnancy has been complicated by obesity with BMI greater than 40, adjustment disorder with mixed disturbance of emotions and conduct, history of self harm/overdose, oligohydramnios.    She reports irregular cramping contractions.   She denies leakage of fluid.  She reports loss of mucous plug in the last couple days.  She denies vaginal bleeding.   She reports fetal movement.    Total weight gain for pregnancy: 7.711 kg   Obstetrical Problem List: pregnancy Problems (from 06/22/19 to present)    Problem Noted Resolved   Obesity affecting pregnancy 09/05/2019 by Conard Novak, MD No   BMI 36.0-36.9,adult 09/05/2019 by Conard Novak, MD No   Supervision of high risk pregnancy, antepartum 06/22/2019 by Tresea Mall, CNM No   Overview Addendum 10/03/2019  9:30 AM by Natale Milch, MD    Clinic [facility] Prenatal Labs  Dating  LMP Blood type: A/Positive/-- (12/21 1601)   Genetic Screen   NIPS:nml XX Antibody:Negative (12/21 1601)  Anatomic [place] complete Rubella: Nonimmune Varicella: ?  GTT Early:1Hr:158  3hr NML  3rd T 3 hr: normal  RPR: Non Reactive (12/21 1601)   Rhogam Not needed HBsAg: Negative (12/21 1601)   Vaccines TDAP: 11/29/19                    Flu Shot: 06/22/19 HIV: Non Reactive (12/21 1601)   Baby Food Breast                               GBS: negative 6/9 GC/CT neg/neg  Contraception  Pap:06/2019 nml  CBB     CS/VBAC NA   Support Person Boyfriend [name]          Previous Version       Maternal Medical History:   Past Medical History:  Diagnosis Date  . DUB (dysfunctional uterine bleeding)   . Pelvic pain     Past Surgical History:  Procedure Laterality Date  . NO PAST SURGERIES      No Known Allergies  Prior to Admission  medications   Medication Sig Start Date End Date Taking? Authorizing Provider  Prenatal Vit-Fe Fumarate-FA (PRENATAL VITAMINS) 28-0.8 MG TABS Take 1 tablet by mouth daily. Patient not taking: Reported on 01/30/2020 01/25/20   Farrel Conners, CNM    OB History  Gravida Para Term Preterm AB Living  1 0 0 0 0 0  SAB TAB Ectopic Multiple Live Births  0 0 0 0      # Outcome Date GA Lbr Len/2nd Weight Sex Delivery Anes PTL Lv  1 Current             Prenatal care site: [facility]  Social History: She  reports that she has quit smoking. Her smoking use included cigarettes. She has never used smokeless tobacco. She reports current alcohol use. She reports previous drug use. Drug: Marijuana.  Family History:   Heart Disease: father She denies a family history of gynecologic cancers  Review of Systems:  Review of Systems  Constitutional: Negative for chills and fever.  HENT: Negative for congestion, ear discharge, ear pain, hearing loss, sinus pain and sore throat.   Eyes: Negative for blurred vision and double vision.  Respiratory: Negative for cough, shortness of breath and wheezing.   Cardiovascular: Negative for chest pain, palpitations and leg swelling.  Gastrointestinal: Positive for abdominal pain. Negative for blood in stool, constipation, diarrhea, heartburn, melena, nausea and vomiting.  Genitourinary: Negative for dysuria, flank pain, frequency, hematuria and urgency.  Musculoskeletal: Negative for back pain, joint pain and myalgias.  Skin: Negative for itching and rash.  Neurological: Negative for dizziness, tingling, tremors, sensory change, speech change, focal weakness, seizures, loss of consciousness, weakness and headaches.  Endo/Heme/Allergies: Negative for environmental allergies. Does not bruise/bleed easily.  Psychiatric/Behavioral: Negative for depression, hallucinations, memory loss, substance abuse and suicidal ideas. The patient is not nervous/anxious and  does not have insomnia.      Physical Exam:  BP 134/76 (BP Location: Left Arm)   Pulse 76   Temp (!) 97.3 F (36.3 C) (Oral)   Resp 16   Ht 5' (1.524 m)   Wt 97.1 kg   LMP 04/16/2019 (Exact Date)   BMI 41.79 kg/m   Constitutional: Well nourished, well developed female in no acute distress.  HEENT: normal Skin: Warm and dry.  Cardiovascular: Regular rate and rhythm.   Extremity: trace edema  Respiratory: Clear to auscultation bilateral. Normal respiratory effort Abdomen: FHT present Back: no CVAT Neuro: DTRs 2+, Cranial nerves grossly intact Psych: Alert and Oriented x3. No memory deficits. Normal mood and affect.  MS: normal gait, normal bilateral lower extremity ROM/strength/stability.  Pelvic exam: (female chaperone present) is not limited by body habitus EGBUS: within normal limits Vagina: within normal limits and with normal mucosa, mucous discharge Cervix: posterior behind low fetal vertex, long, -1   Baseline FHR: 140 beats/min   Variability: moderate   Accelerations: present   Decelerations: absent Contractions: present frequency: irregular Overall assessment: reassuring   Lab Results  Component Value Date   [place] NEGATIVE 01/30/2020    Assessment:  Dawn Richards is a 24 y.o. G1P0000 female at [redacted]w[redacted]d with oligohydramnios diagnosed today and sent from office for induction of labor.   Plan:  1. Admit to Labor & Delivery  2. CBC, T&S, Clrs, IVF 3. GBS negative.   4. Fetal well-being: Category I 5. Begin induction with cytotec vaginal  Rod Can, CNM 01/30/2020 2:49 PM

## 2020-01-31 ENCOUNTER — Encounter: Payer: Self-pay | Admitting: Obstetrics and Gynecology

## 2020-01-31 DIAGNOSIS — O0990 Supervision of high risk pregnancy, unspecified, unspecified trimester: Secondary | ICD-10-CM

## 2020-01-31 DIAGNOSIS — O4103X0 Oligohydramnios, third trimester, not applicable or unspecified: Principal | ICD-10-CM

## 2020-01-31 LAB — RPR: RPR Ser Ql: NONREACTIVE

## 2020-01-31 MED ORDER — ONDANSETRON HCL 4 MG/2ML IJ SOLN: 4.0000 mg | INTRAMUSCULAR | Status: DC | PRN

## 2020-01-31 MED ORDER — OXYCODONE HCL 5 MG PO TABS: 5.0000 mg | ORAL_TABLET | ORAL | Status: DC | PRN

## 2020-01-31 MED ORDER — ONDANSETRON HCL 4 MG PO TABS: 4.0000 mg | ORAL_TABLET | ORAL | Status: DC | PRN

## 2020-01-31 MED ORDER — BENZOCAINE-MENTHOL 20-0.5 % EX AERO
1.0000 "application " | INHALATION_SPRAY | CUTANEOUS | Status: DC | PRN
  Administered 2020-01-31: 1 via TOPICAL
  Filled 2020-01-31: qty 56

## 2020-01-31 MED ORDER — WITCH HAZEL-GLYCERIN EX PADS: 1.0000 "application " | MEDICATED_PAD | CUTANEOUS | Status: DC | PRN

## 2020-01-31 MED ORDER — DIPHENHYDRAMINE HCL 25 MG PO CAPS: 25.0000 mg | ORAL_CAPSULE | Freq: Four times a day (QID) | ORAL | Status: DC | PRN

## 2020-01-31 MED ORDER — SENNOSIDES-DOCUSATE SODIUM 8.6-50 MG PO TABS
2.0000 | ORAL_TABLET | ORAL | Status: DC
  Administered 2020-02-01: 2 via ORAL
  Filled 2020-01-31: qty 2

## 2020-01-31 MED ORDER — DIBUCAINE (PERIANAL) 1 % EX OINT: 1.0000 "application " | TOPICAL_OINTMENT | CUTANEOUS | Status: DC | PRN

## 2020-01-31 MED ORDER — IBUPROFEN 600 MG PO TABS
600.0000 mg | ORAL_TABLET | Freq: Four times a day (QID) | ORAL | Status: DC
  Administered 2020-01-31 – 2020-02-01 (×4): 600 mg via ORAL
  Filled 2020-01-31 (×4): qty 1

## 2020-01-31 MED ORDER — ACETAMINOPHEN 325 MG PO TABS
650.0000 mg | ORAL_TABLET | ORAL | Status: DC | PRN
  Administered 2020-01-31: 650 mg via ORAL

## 2020-01-31 MED ORDER — SIMETHICONE 80 MG PO CHEW: 80.0000 mg | CHEWABLE_TABLET | ORAL | Status: DC | PRN

## 2020-01-31 MED ORDER — COCONUT OIL OIL: 1.0000 "application " | TOPICAL_OIL | Status: DC | PRN

## 2020-01-31 MED ORDER — OXYCODONE HCL 5 MG PO TABS: 10.0000 mg | ORAL_TABLET | ORAL | Status: DC | PRN

## 2020-01-31 MED ORDER — ZOLPIDEM TARTRATE 5 MG PO TABS: 5.0000 mg | ORAL_TABLET | Freq: Every evening | ORAL | Status: DC | PRN

## 2020-01-31 MED ORDER — PRENATAL MULTIVITAMIN CH: 1.0000 | ORAL_TABLET | Freq: Every day | ORAL | Status: DC

## 2020-01-31 NOTE — Discharge Summary (Signed)
   Postpartum Discharge Summary  Date of Service updated  02/01/2020 0900     Patient Name: Dawn Richards DOB: 10/04/1995 MRN: [id number]  Date of admission: 01/30/2020 Delivery date:01/31/2020  Delivering provider: FRYER, MARGARET M  Date of discharge: 02/01/2020  Admitting diagnosis: Oligohydramnios in singleton pregnancy in third trimester [O41.03X0] Intrauterine pregnancy: [redacted]w[redacted]d     Secondary diagnosis:  Active Problems:   Oligohydramnios in singleton pregnancy in third trimester   Postpartum care following vaginal delivery  Additional problems: high BMI, hx of MJ use    Discharge diagnosis: Term Pregnancy Delivered                                              Post partum procedures:none Augmentation: AROM and Pitocin Complications: None  Hospital course: Induction of Labor With Vaginal Delivery   24 y.o. yo G1P1001 at [redacted]w[redacted]d was admitted to the hospital 01/30/2020 for induction of labor.  Indication for induction: oligohydramnios.  Patient had an uncomplicated labor course as follows: Membrane Rupture Time/Date: 9:30 AM ,01/31/2020   Delivery Method:Vaginal, Spontaneous  Episiotomy: Median  Lacerations:  None  Details of delivery can be found in separate delivery note.  Patient had a routine postpartum course. Patient is discharged home 01/31/20.  Newborn Data: Birth date:01/31/2020  Birth time:10:05 AM  Gender:Female  Living status:Living  Apgars:7 ,9  Weight:3270 g   Magnesium Sulfate received: No BMZ received: No Rhophylac:No MMR:No T-DaP:Given prenatally Flu: Yes Transfusion:No  Physical exam  Vitals:   01/31/20 0450 01/31/20 0735 01/31/20 0736 01/31/20 1018  BP: 136/82  (!) 131/52 (!) 141/61  Pulse: 76 81 79   Resp: 19 18    Temp:  97.9 F (36.6 C)    TempSrc:  Oral    Weight:      Height:       General: alert, cooperative and no distress Lochia: appropriate Uterine Fundus: firm Incision: Healing well with no significant  drainage DVT Evaluation: No evidence of DVT seen on physical exam. Negative Homan's sign. No cords or calf tenderness. Labs: Lab Results  Component Value Date   WBC 9.1 01/30/2020   HGB 10.5 (L) 01/30/2020   HCT 31.7 (L) 01/30/2020   MCV 87.3 01/30/2020   PLT 238 01/30/2020   CMP Latest Ref Rng & Units 01/25/2020  Glucose 70 - 99 mg/dL 75  BUN 6 - 20 mg/dL 8  Creatinine 0.44 - 1.00 mg/dL 0.39(L)  Sodium 135 - 145 mmol/L 136  Potassium 3.5 - 5.1 mmol/L 3.6  Chloride 98 - 111 mmol/L 108  CO2 22 - 32 mmol/L 21(L)  Calcium 8.9 - 10.3 mg/dL 8.3(L)  Total Protein 6.5 - 8.1 g/dL 6.0(L)  Total Bilirubin 0.3 - 1.2 mg/dL 0.5  Alkaline Phos 38 - 126 U/L 137(H)  AST 15 - 41 U/L 13(L)  ALT 0 - 44 U/L 8   Edinburgh Score: No flowsheet data found.    After visit meds:  Allergies as of 01/31/2020   No Known Allergies          Discharge home in stable condition Infant Feeding: both breast and bottle feeding planned Infant Disposition:home with mother Discharge instruction: per After Visit Summary and Postpartum booklet. Activity: Advance as tolerated. Pelvic rest for 6 weeks.  Diet: routine diet Anticipated Birth Control: IUD Postpartum Appointment:6 weeks and sooner PRN Additional Postpartum F/U: Postpartum Depression checkup   if needed Future Appointments:No future appointments. Follow up Visit:  Follow-up Information    Fryer, Margaret M, CNM. Schedule an appointment as soon as possible for a visit in 6 week(s).   Specialties: Obstetrics, Gynecology Contact information: [address] [place] [address] [phone]                   01/31/2020 Margaret M Fryer, CNM  

## 2020-01-31 NOTE — Lactation Note (Signed)
This note was copied from a baby's chart. Lactation Consultation Note  Patient Name: Dawn Richards [id number] Date: 01/31/2020 Reason for consult: Initial assessment;1st time breastfeeding  LC in to see mom and baby. This is mom's first baby, and first breastfeeding experience. She reports brief feedings since delivery, but unsure. LC began by explaining early hunger cues as dad was holding baby. Dad identified quickly the cues, and encouraged mom to feed the baby. Mom was anxious when given the baby, unsure of placement/position, and how to offer the breast.  LC assisted mom by verbally explaining and demonstrating cross-cradle hold for better control, and use of opposite hand to sandwich the breast tissue. LC assisted with latch, bringing baby towards mom instead of mom towards baby. Mom voices being nervous of hurting the baby, LC provided reassurance and gave direction for getting a better position. Newborn feeding behaviors, early cues, stomach size, output expectations, and encouragement of rest when possible all reviewed with parents. Parents verbalize understanding, and know to call/seek support with feedings as needed.   Maternal Data Formula Feeding for Exclusion: No Has patient been taught Hand Expression?: Yes Does the patient have breastfeeding experience prior to this delivery?: No  Feeding Feeding Type: Breast Fed  LATCH Score Latch: Grasps breast easily, tongue down, lips flanged, rhythmical sucking.  Audible Swallowing: A few with stimulation  Type of Nipple: Everted at rest and after stimulation  Comfort (Breast/Nipple): Soft / non-tender  Hold (Positioning): Full assist, staff holds infant at breast (mom scared of hurting baby)  LATCH Score: 7  Interventions Interventions: Breast feeding basics reviewed;Assisted with latch;Hand express;Skin to skin;Breast compression;Adjust position;Support pillows  Lactation Tools Discussed/Used     Consult  Status Consult Status: Follow-up Date: 02/01/20 Follow-up type: In-patient    Danford Bad 01/31/2020, 4:44 PM

## 2020-02-01 LAB — CBC
HCT: 28.8 % — ABNORMAL LOW (ref 36.0–46.0)
Hemoglobin: 9.5 g/dL — ABNORMAL LOW (ref 12.0–15.0)
MCH: 29.1 pg (ref 26.0–34.0)
MCHC: 33 g/dL (ref 30.0–36.0)
MCV: 88.3 fL (ref 80.0–100.0)
Platelets: 225 10*3/uL (ref 150–400)
RBC: 3.26 MIL/uL — ABNORMAL LOW (ref 3.87–5.11)
RDW: 13.2 % (ref 11.5–15.5)
WBC: 12.4 10*3/uL — ABNORMAL HIGH (ref 4.0–10.5)
nRBC: 0 % (ref 0.0–0.2)

## 2020-02-01 MED ORDER — IBUPROFEN 600 MG PO TABS: 600.0000 mg | ORAL_TABLET | Freq: Four times a day (QID) | ORAL | 0 refills | Status: DC

## 2020-02-01 NOTE — Progress Notes (Signed)
DC instructions reviewed with pt.  Verb u/o of care of self and NB at home.  Pt knows to make f/u appointment and inform staff that she plans to have IUD placed at that visit.  Questions answered and reviewed S/S of when to notify MD of concerns.

## 2020-02-01 NOTE — Progress Notes (Signed)
Post Partum Day 1 Subjective: no complaints, up ad lib, voiding, tolerating PO and reports her bleeding is moderate.  she would like to be discharged today. Has not been breastfeeding although she plans to"do both"  Objective: Blood pressure 120/86, pulse 66, temperature 98 F (36.7 C), temperature source Oral, resp. rate 18, height 5' (1.524 m), weight 97.1 kg, last menstrual period 04/16/2019, SpO2 99 %, unknown if currently breastfeeding.  Physical Exam:  General: alert, cooperative, appears stated age and moderately obese Lochia: appropriate Uterine Fundus: firm Incision: healing well, no significant drainage, little edema DVT Evaluation: No evidence of DVT seen on physical exam. Negative Homan's sign. No cords or calf tenderness.  Recent Labs    01/30/20 1226 02/01/20 0432  HGB 10.5* 9.5*  HCT 31.7* 28.8*    Assessment/Plan: Discharge home, Breastfeeding, Lactation consult and Contraception plans now on a Mirena IUD.   LOS: 2 days   Mirna Mires 02/01/2020, 9:07 AM

## 2020-02-01 NOTE — Discharge Instructions (Signed)
Perinatal Depression When a woman feels excessive sadness, anger, or anxiety during pregnancy or during the first 12 months after she gives birth, she has a condition called perinatal depression. Depression can interfere with work, school, relationships, and other everyday activities. If it is not managed properly, it can also cause problems in the mother and her baby. Sometimes, perinatal depression is left untreated because symptoms are thought to be normal mood swings during and right after pregnancy. If you have symptoms of depression, it is important to talk with your health care provider. What are the causes? The exact cause of this condition is not known. Hormonal changes during and after pregnancy may play a role in causing perinatal depression. What increases the risk? You are more likely to develop this condition if: You have a personal or family history of depression, anxiety, or mood disorders. You experience a stressful life event during pregnancy, such as the death of a loved one. You have a lot of regular life stress. You do not have support from family members or loved ones, or you are in an abusive relationship. What are the signs or symptoms? Symptoms of this condition include: Feeling sad or hopeless. Feelings of guilt. Feeling irritable or overwhelmed. Changes in your appetite. Lack of energy or motivation. Sleep problems. Difficulty concentrating or completing tasks. Loss of interest in hobbies or relationships. Headaches or stomach problems that do not go away. How is this diagnosed? This condition is diagnosed based on a physical exam and mental evaluation. In some cases, your health care provider may use a depression screening tool. These tools include a list of questions that can help a health care provider diagnose depression. Your health care provider may refer you to a mental health expert who specializes in depression. How is this treated? This condition may be  treated with: Medicines. Your health care provider will only give you medicines that have been proven safe for pregnancy and breastfeeding. Talk therapy with a mental health professional to help change your patterns of thinking (cognitive behavioral therapy). Support groups. Brain stimulation or light therapies. Stress reduction therapies, such as mindfulness. Follow these instructions at home: Lifestyle Do not use any products that contain nicotine or tobacco, such as cigarettes and e-cigarettes. If you need help quitting, ask your health care provider. Do not use alcohol when you are pregnant. After your baby is born, limit alcohol intake to no more than 1 drink a day. One drink equals 12 oz of beer, 5 oz of wine, or 1 oz of hard liquor. Consider joining a support group for new mothers. Ask your health care provider for recommendations. Take good care of yourself. Make sure you: Get plenty of sleep. If you are having trouble sleeping, talk with your health care provider. Eat a healthy diet. This includes plenty of fruits and vegetables, whole grains, and lean proteins. Exercise regularly, as told by your health care provider. Ask your health care provider what exercises are safe for you. General instructions Take over-the-counter and prescription medicines only as told by your health care provider. Talk with your partner or family members about your feelings during pregnancy. Share any concerns or anxieties that you may have. Ask for help with tasks or chores when you need it. Ask friends and family members to provide meals, watch your children, or help with cleaning. Keep all follow-up visits as told by your health care provider. This is important. Contact a health care provider if: You (or people close to you) notice  that you have any symptoms of depression. You have depression and your symptoms get worse. You experience side effects from medicines, such as nausea or sleep problems. Get  help right away if: You feel like hurting yourself, your baby, or someone else. If you ever feel like you may hurt yourself or others, or have thoughts about taking your own life, get help right away. You can go to your nearest emergency department or call: Your local emergency services (911 in the [place] A suicide crisis helpline, such as the National Suicide Prevention Lifeline at 580-700-1109. This is open 24 hours a day. Summary Perinatal depression is when a woman feels excessive sadness, anger, or anxiety during pregnancy or during the first 12 months after she gives birth. If perinatal depression is not treated, it can lead to health problems for the mother and her baby. This condition is treated with medicines, talk therapy, stress reduction therapies, or a combination of two or more treatments. Talk with your partner or family members about your feelings. Do not be afraid to ask for help. This information is not intended to replace advice given to you by your health care provider. Make sure you discuss any questions you have with your health care provider. Document Revised: 01/12/2019 Document Reviewed: 09/24/2016 Elsevier Patient Education  2020 [employer]. Breastfeeding Tips for a Good Latch Latching is how your baby's mouth attaches to your nipple to breastfeed. It is an important part of breastfeeding. Your baby may have trouble latching for a number of reasons. A poor latch may cause you to have cracked or sore nipples or other problems. Follow these instructions at home: How to position your baby  Find a comfortable place to sit or lie down. Your neck and back should be well supported.  If you are seated, place a pillow or rolled-up blanket under your baby. This will bring him or her to the level of your breast.  Make sure that your baby's belly (abdomen) is facing your belly.  Try different positions to find one that works best for you and your baby. How to help your  baby latch   To start, gently rub your breast. Move your fingertips in a circle as you massage from your chest wall toward your nipple. This helps milk flow. Keep doing this during feeding if needed.  Position your breast. Hold your breast with four fingers underneath and your thumb above your nipple. Keep your fingers away from your nipple and your baby's mouth. Follow these steps to help your baby latch: 1. Rub your baby's lips gently with your finger or nipple. 2. When your baby's mouth is open wide enough, quickly bring your baby to your breast and place your whole nipple into your baby's mouth. Place as much of the colored area around your nipple (areola)as possible into your baby's mouth. 3. Your baby's tongue should be between his or her lower gum and your breast. 4. You should be able to see more areola above your baby's upper lip than below the lower lip. 5. When your baby starts sucking, you will feel a gentle pull on your nipple. You should not feel any pain. Be patient. It is common for a baby to suck for about 2-3 minutes to start the flow of breast milk. 6. Make sure that your baby's mouth is in the right position around your nipple. Your baby's lips should make a seal on your breast and be turned outward.  General instructions  Look for these  signs that your baby has latched on to your nipple: ? The baby is quietly tugging or sucking without causing you pain. ? You hear the baby swallow after every 3 or 4 sucks. ? You see movement above and in front of the baby's ears while he or she is sucking.  Be aware of these signs that your baby has not latched on to your nipple: ? The baby makes sucking sounds or smacking sounds while feeding. ? You have nipple pain.  If your baby is not latched well, put your little finger between your baby's gums and your nipple. This will break the seal. Then try to help your baby latch again.  If you keep having problems, get help from a  breastfeeding specialist (Science writer). Contact a doctor if:  You have cracking or soreness in your nipples that lasts longer than 1 week.  You have nipple pain.  Your breasts are filled with too much milk (engorgement), and this does not improve after 48-72 hours.  You have a plugged milk duct and a fever.  You follow the tips for a good latch but you keep having problems or concerns.  You have a pus-like fluid coming from your breast.  Your baby is not gaining weight.  Your baby loses weight. Summary  Latching is how your baby's mouth attaches to your nipple to breastfeed.  Try different positions for breastfeeding to find one that works best for you and your baby.  A poor latch may cause you to have cracked or sore nipples or other problems. This information is not intended to replace advice given to you by your health care provider. Make sure you discuss any questions you have with your health care provider. Document Revised: 11/17/2018 Document Reviewed: 03/04/2017 Elsevier Patient Education  2020 [employer]. Postpartum Care After Vaginal Delivery This sheet gives you information about how to care for yourself from the time you deliver your baby to up to 6-12 weeks after delivery (postpartum period). Your health care provider may also give you more specific instructions. If you have problems or questions, contact your health care provider. Follow these instructions at home: Vaginal bleeding  It is normal to have vaginal bleeding (lochia) after delivery. Wear a sanitary pad for vaginal bleeding and discharge. ? During the first week after delivery, the amount and appearance of lochia is often similar to a menstrual period. ? Over the next few weeks, it will gradually decrease to a dry, yellow-brown discharge. ? For most women, lochia stops completely by 4-6 weeks after delivery. Vaginal bleeding can vary from woman to woman.  Change your sanitary pads frequently.  Watch for any changes in your flow, such as: ? A sudden increase in volume. ? A change in color. ? Large blood clots.  If you pass a blood clot from your vagina, save it and call your health care provider to discuss. Do not flush blood clots down the toilet before talking with your health care provider.  Do not use tampons or douches until your health care provider says this is safe.  If you are not breastfeeding, your period should return 6-8 weeks after delivery. If you are feeding your child breast milk only (exclusive breastfeeding), your period may not return until you stop breastfeeding. Perineal care  Keep the area between the vagina and the anus (perineum) clean and dry as told by your health care provider. Use medicated pads and pain-relieving sprays and creams as directed.  If you had a  cut in the perineum (episiotomy) or a tear in the vagina, check the area for signs of infection until you are healed. Check for: ? More redness, swelling, or pain. ? Fluid or blood coming from the cut or tear. ? Warmth. ? Pus or a bad smell.  You may be given a squirt bottle to use instead of wiping to clean the perineum area after you go to the bathroom. As you start healing, you may use the squirt bottle before wiping yourself. Make sure to wipe gently.  To relieve pain caused by an episiotomy, a tear in the vagina, or swollen veins in the anus (hemorrhoids), try taking a warm sitz bath 2-3 times a day. A sitz bath is a warm water bath that is taken while you are sitting down. The water should only come up to your hips and should cover your buttocks. Breast care  Within the first few days after delivery, your breasts may feel heavy, full, and uncomfortable (breast engorgement). Milk may also leak from your breasts. Your health care provider can suggest ways to help relieve the discomfort. Breast engorgement should go away within a few days.  If you are breastfeeding: ? Wear a bra that supports  your breasts and fits you well. ? Keep your nipples clean and dry. Apply creams and ointments as told by your health care provider. ? You may need to use breast pads to absorb milk that leaks from your breasts. ? You may have uterine contractions every time you breastfeed for up to several weeks after delivery. Uterine contractions help your uterus return to its normal size. ? If you have any problems with breastfeeding, work with your health care provider or Advertising copywriter.  If you are not breastfeeding: ? Avoid touching your breasts a lot. Doing this can make your breasts produce more milk. ? Wear a good-fitting bra and use cold packs to help with swelling. ? Do not squeeze out (express) milk. This causes you to make more milk. Intimacy and sexuality  Ask your health care provider when you can engage in sexual activity. This may depend on: ? Your risk of infection. ? How fast you are healing. ? Your comfort and desire to engage in sexual activity.  You are able to get pregnant after delivery, even if you have not had your period. If desired, talk with your health care provider about methods of birth control (contraception). Medicines  Take over-the-counter and prescription medicines only as told by your health care provider.  If you were prescribed an antibiotic medicine, take it as told by your health care provider. Do not stop taking the antibiotic even if you start to feel better. Activity  Gradually return to your normal activities as told by your health care provider. Ask your health care provider what activities are safe for you.  Rest as much as possible. Try to rest or take a nap while your baby is sleeping. Eating and drinking   Drink enough fluid to keep your urine pale yellow.  Eat high-fiber foods every day. These may help prevent or relieve constipation. High-fiber foods include: ? Whole grain cereals and breads. ? Brown rice. ? Beans. ? Fresh fruits and  vegetables.  Do not try to lose weight quickly by cutting back on calories.  Take your prenatal vitamins until your postpartum checkup or until your health care provider tells you it is okay to stop. Lifestyle  Do not use any products that contain nicotine or tobacco, such as  cigarettes and e-cigarettes. If you need help quitting, ask your health care provider.  Do not drink alcohol, especially if you are breastfeeding. General instructions  Keep all follow-up visits for you and your baby as told by your health care provider. Most women visit their health care provider for a postpartum checkup within the first 3-6 weeks after delivery. Contact a health care provider if:  You feel unable to cope with the changes that your child brings to your life, and these feelings do not go away.  You feel unusually sad or worried.  Your breasts become red, painful, or hard.  You have a fever.  You have trouble holding urine or keeping urine from leaking.  You have little or no interest in activities you used to enjoy.  You have not breastfed at all and you have not had a menstrual period for 12 weeks after delivery.  You have stopped breastfeeding and you have not had a menstrual period for 12 weeks after you stopped breastfeeding.  You have questions about caring for yourself or your baby.  You pass a blood clot from your vagina. Get help right away if:  You have chest pain.  You have difficulty breathing.  You have sudden, severe leg pain.  You have severe pain or cramping in your lower abdomen.  You bleed from your vagina so much that you fill more than one sanitary pad in one hour. Bleeding should not be heavier than your heaviest period.  You develop a severe headache.  You faint.  You have blurred vision or spots in your vision.  You have bad-smelling vaginal discharge.  You have thoughts about hurting yourself or your baby. If you ever feel like you may hurt yourself  or others, or have thoughts about taking your own life, get help right away. You can go to the nearest emergency department or call:  Your local emergency services (911 in the [place]  A suicide crisis helpline, such as the National Suicide Prevention Lifeline at 747-154-35801-(970) 377-8083. This is open 24 hours a day. Summary  The period of time right after you deliver your newborn up to 6-12 weeks after delivery is called the postpartum period.  Gradually return to your normal activities as told by your health care provider.  Keep all follow-up visits for you and your baby as told by your health care provider. This information is not intended to replace advice given to you by your health care provider. Make sure you discuss any questions you have with your health care provider. Document Revised: 07/31/2017 Document Reviewed: 05/11/2017 Elsevier Patient Education  2020 [employer]

## 2020-02-01 NOTE — Lactation Note (Signed)
This note was copied from a baby's chart. Lactation Consultation Note  Patient Name: Dawn Richards JOINO'M Date: 02/01/2020 Reason for consult: Follow-up assessment;1st time breastfeeding;Term  LC in to see mom and baby this morning. Overnight records show baby breastfed throughout the night, but RN reports formula being attempted this morning; baby did not accept. Mom is reporting nipple soreness and tenderness, but no visible damage on either nipple. LC educated mom on transient nipple pain/soreness, comfort gels provided, and discussed benefits of expressed colostrum and use of coconut oil. LC reiterated importance of baby's position and alignment, and wide open mouth to ensure deep latch. LC provided reassurance that baby was satisfied with breastmilk due to output of void and stools. Reviewed newborn feeding behaviors, cluster feeding/growth spurts, milk supply and demand, and normal course of lactation. Mom is enrolled in [facility] in [place], and would like the option of pump use later on, but when offered to begin here in the hospital mom declined, saying she was tired and planned to go home later. LC will send referral over to [facility], but told mom to call to follow-up.  LC encouraged continued tracking of wet/stool diapers for days/weeks to come, provided other signs to determine adequate transfer, and tips for keeping baby alert at the breast while feeding to ensure adequate transfer. Educated mom on nipple and breast care, breast fullness vs engorgement, signs of plugged ducts and mastitis, and when to seek help from MD. Information given for outpatient lactation services, and community breastfeeding support. Encouraged mom to call out today with questions/concerns, or for assistance with feedings before going home.  Maternal Data Formula Feeding for Exclusion: No Has patient been taught Hand Expression?: Yes Does the patient have breastfeeding experience prior to this  delivery?: No  Feeding    LATCH Score                   Interventions Interventions: Breast feeding basics reviewed;Comfort gels;Coconut oil;Hand express  Lactation Tools Discussed/Used WIC Program: Yes   Consult Status Consult Status: Complete Date: 02/01/20 Follow-up type: Call as needed    Danford Bad 02/01/2020, 9:32 AM

## 2020-02-01 NOTE — Progress Notes (Signed)
DC to home to car via auxillary with NB

## 2020-02-01 NOTE — Final Progress Note (Signed)
Physician Final Progress Note  Patient ID: Dawn Richards MRN: [id number] DOB/AGE: [birth year] 24 y.o.  Admit date: 01/30/2020 Admitting provider: Conard Novak, MD Discharge date: 02/01/2020   Admission Diagnoses: term pregnancy for induction of labor  Discharge Diagnoses:  Active Problems:   Oligohydramnios in singleton pregnancy in third trimester   Postpartum care following vaginal delivery    Consults: None  Significant Findings/ Diagnostic Studies: NA  Procedures: normal labor and vaginal delivery  Discharge Condition: good  Disposition: Discharge disposition: 01-Home or Self Care       Diet: Regular diet  Discharge Activity: Activity as tolerated, No driving for 2 weeks, No sex for 6 weeks and No heavy lifting for 4 weeks  Discharge Instructions    Activity as tolerated   Complete by: As directed    Call MD for:   Complete by: As directed    Call MD for:  difficulty breathing, headache or visual disturbances   Complete by: As directed    Call MD for:  extreme fatigue   Complete by: As directed    Call MD for:  hives   Complete by: As directed    Call MD for:  persistant dizziness or light-headedness   Complete by: As directed    Call MD for:  persistant nausea and vomiting   Complete by: As directed    Call MD for:  redness, tenderness, or signs of infection (pain, swelling, redness, odor or green/yellow discharge around incision site)   Complete by: As directed    Call MD for:  severe uncontrolled pain   Complete by: As directed    Call MD for:  temperature >100.4   Complete by: As directed    Diet - low sodium heart healthy   Complete by: As directed    Sexual activity   Complete by: As directed    Avoid intercourse for 6 weeks     Allergies as of 02/01/2020   No Known Allergies     Medication List    TAKE these medications   ibuprofen 600 MG tablet Commonly known as: ADVIL Take 1 tablet (600 mg total) by mouth every 6  (six) hours.   Prenatal Vitamins 28-0.8 MG Tabs Take 1 tablet by mouth daily.       Follow-up Information    Mirna Mires, CNM. Call in 6 week(s).   Specialties: Obstetrics, Gynecology Why: when making you r 6 week appointment, please tell the office that you are also planning on having an IUD placed at that appointment.  You may alsomake an appointment at 2 weeks after delivery if you are having any problems. Contact information: [address] [place] [address] [phone]               Total time spent taking care of this patient: 45 minutes  Signed: Mirna Mires 02/01/2020, 9:17 AM

## 2020-06-12 ENCOUNTER — Ambulatory Visit: Payer: Medicaid Other

## 2021-01-17 ENCOUNTER — Ambulatory Visit: Payer: Medicaid Other | Admitting: Physician Assistant

## 2021-01-17 ENCOUNTER — Encounter: Payer: Self-pay | Admitting: Physician Assistant

## 2021-01-17 ENCOUNTER — Other Ambulatory Visit: Payer: Self-pay

## 2021-01-17 DIAGNOSIS — Z113 Encounter for screening for infections with a predominantly sexual mode of transmission: Secondary | ICD-10-CM | POA: Diagnosis not present

## 2021-01-17 LAB — PREGNANCY, URINE: Preg Test, Ur: NEGATIVE

## 2021-01-17 LAB — WET PREP FOR TRICH, YEAST, CLUE
Trichomonas Exam: NEGATIVE
Yeast Exam: NEGATIVE

## 2021-01-17 NOTE — Progress Notes (Signed)
[facility] Department STI clinic/screening visit  Subjective:  Dawn Richards is a 25 y.o. female being seen today for an STI screening visit. The patient reports they do have symptoms.  Patient reports that they do not desire a pregnancy in the next year.   They reported they are not interested in discussing contraception today.  No LMP recorded (approximate).   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  Reports a history of "very irregular" periods.  States that she has not had any surgeries and does not take any medicines regularly.  Reports that her partner may have cheated and wants to make sure everything is OK.  States last HIV test was in 2021 and last pap was in 2020.  LMP was sometime in 09/2020 and does not use any condoms or hormonal method of birth control.   See flowsheet for further details and programmatic requirements.    The following portions of the patient's history were reviewed and updated as appropriate: allergies, current medications, past medical history, past social history,  past surgical history and problem list.  Objective:  There were no vitals filed for this visit.  Physical Exam Constitutional:      General: She is not in acute distress.    Appearance: Normal appearance.  HENT:     Head: Normocephalic and atraumatic.     Comments: No nits,lice, or hair loss. No cervical, supraclavicular or axillary adenopathy.     Mouth/Throat:     Mouth: Mucous membranes are moist.     Pharynx: Oropharynx is clear. No oropharyngeal exudate or posterior oropharyngeal erythema.  Eyes:     Conjunctiva/sclera: Conjunctivae normal.  Pulmonary:     Effort: Pulmonary effort is normal.  Abdominal:     Palpations: Abdomen is soft. There is no mass.     Tenderness: There is no abdominal tenderness. There is no guarding or rebound.  Genitourinary:    General: Normal vulva.     Rectum: Normal.     Comments: External genitalia/pubic area without nits, lice, edema, erythema, lesions and inguinal adenopathy. Vagina with normal mucosa and discharge. Cervix without visible lesions. Uterus firm, mobile, nt, no masses, no CMT, no adnexal tenderness or fullness.  Musculoskeletal:     Cervical back: Neck supple. No tenderness.  Skin:    General: Skin is warm and dry.     Findings: No bruising, erythema, lesion or rash.  Neurological:     Mental Status: She is alert and oriented to person, place, and time.  Psychiatric:        Mood and Affect: Mood normal.        Behavior: Behavior normal.  Thought Content: Thought content normal.        Judgment: Judgment normal.     Assessment and Plan:  Dawn Richards is a 25 y.o. female presenting to the [facility] Department for STI screening  1. Screening for STD (sexually transmitted disease) Patient into clinic with symptoms. Reviewed wet mount results and no treatment is indicated today. Rec that RN tell patient that she can use OTC antifungal cream for symptom relief while waiting on other  results. Rec condoms with all sex. Await test results.  Counseled that RN will call if needs to RTC for treatment once results are back.  - Pregnancy, urine - WET PREP FOR TRICH, YEAST, CLUE - Chlamydia/Gonorrhea [place] [facility] - HIV [place] [facility] - Syphilis Serology, [place] [facility]     No follow-ups on file.  No future appointments.  Matt Holmes, PA

## 2021-01-22 ENCOUNTER — Telehealth: Payer: Self-pay

## 2021-01-22 NOTE — Telephone Encounter (Signed)
Positive Chlamydia and Gonorrhea from 01/17/2021 visit.  Needs treatment. Left message to return call.

## 2021-01-22 NOTE — Telephone Encounter (Signed)
Verified password Notified of positive chlamydia and gonorrhea Appt scheduled

## 2021-01-23 ENCOUNTER — Other Ambulatory Visit: Payer: Self-pay

## 2021-01-23 ENCOUNTER — Ambulatory Visit: Payer: Medicaid Other

## 2021-01-23 DIAGNOSIS — Z113 Encounter for screening for infections with a predominantly sexual mode of transmission: Secondary | ICD-10-CM

## 2021-01-23 DIAGNOSIS — A749 Chlamydial infection, unspecified: Secondary | ICD-10-CM

## 2021-01-23 DIAGNOSIS — A549 Gonococcal infection, unspecified: Secondary | ICD-10-CM

## 2021-01-23 MED ORDER — AZITHROMYCIN 500 MG PO TABS
1000.0000 mg | ORAL_TABLET | Freq: Once | ORAL | Status: AC
  Administered 2021-01-23: 1000 mg via ORAL

## 2021-01-23 MED ORDER — CEFTRIAXONE SODIUM 500 MG IJ SOLR
500.0000 mg | Freq: Once | INTRAMUSCULAR | Status: AC
  Administered 2021-01-23: 500 mg via INTRAMUSCULAR

## 2021-01-23 NOTE — Progress Notes (Signed)
In Nurse Clinic for chlamydia and gonorrhea treatment. Reports irreg. Periods, LMP 09/2020. Treated for chlamydia with Azithromycin 1 gram po DOT per SO Dr Ralene Bathe. Advised to eat after [facility] visit and to contact [facility] if vomits within 2 hrs of swallowing med. Treated for gonorrhea with Ceftriaxone 500 mg IM once per SO Dr Ralene Bathe. Tolerated well. Pt declined to stay for 20 min observation d/t time constraints. Questions answered and reports understanding. Jerel Shepherd, RN

## 2021-11-06 ENCOUNTER — Ambulatory Visit: Payer: Medicaid Other | Admitting: Nurse Practitioner

## 2021-11-06 DIAGNOSIS — Z113 Encounter for screening for infections with a predominantly sexual mode of transmission: Secondary | ICD-10-CM

## 2021-11-06 LAB — WET PREP FOR TRICH, YEAST, CLUE
Trichomonas Exam: NEGATIVE
Yeast Exam: NEGATIVE

## 2021-11-06 LAB — HM HEPATITIS C SCREENING LAB: HM Hepatitis Screen: NEGATIVE

## 2021-11-06 LAB — HEPATITIS B SURFACE ANTIGEN: Hepatitis B Surface Ag: NONREACTIVE

## 2021-11-06 LAB — HM HIV SCREENING LAB: HM HIV Screening: NEGATIVE

## 2021-11-06 NOTE — Progress Notes (Signed)
Patient seen today for STD testing. Wet prep reviewed. No tx per standing orders. Condoms declined.  ?

## 2021-11-06 NOTE — Progress Notes (Signed)
[facility] Department ? ?STI clinic/screening visit ?[place] [address] ?[id number] ? ?Subjective:  ?Sulay Mitchell is a 26 y.o. female being seen today for an STI screening visit. The patient reports they do have symptoms.  Patient reports that they do not desire a pregnancy in the next year.   They reported they are not interested in discussing contraception today.   ? ?Patient's last menstrual period was 11/05/2021 (exact date). ? ? ?Patient has the following medical conditions:   ?Patient Active Problem List  ? Diagnosis Date Noted  ? Vaginal delivery   ? Postpartum care following vaginal delivery 01/31/2020  ? Oligohydramnios in singleton pregnancy in third trimester 01/30/2020  ? Indication for care in labor and delivery, antepartum 01/25/2020  ? Obesity affecting pregnancy 09/05/2019  ? BMI 36.0-36.9,adult 09/05/2019  ? Supervision of high risk pregnancy, antepartum 06/22/2019  ? 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 NSAID overdose, intentional self-harm, sequela ([facility]) 05/08/2017  ? Female dyspareunia 08/13/2016  ? Dysmenorrhea 08/13/2016  ? Abnormal uterine bleeding (AUB) 08/13/2016  ? ? ?Chief Complaint  ?Patient presents with  ? SEXUALLY TRANSMITTED DISEASE  ? ? ?HPI ? ?Patient reports to clinic today with genital itching, lower abdominal pain, discharge, burning with urination, vaginal irritation, and pain with sex for one week.  ? ?Last HIV test per patient/review of record was 01/17/2021. ?Patient reports last pap was 06/22/2019.  ? ?Screening for MPX risk: ?Does the patient have an unexplained rash? No ?Is the patient MSM? No ?Does the patient endorse multiple sex partners or anonymous sex partners? No ?Did the patient have close or sexual contact with a person diagnosed with MPX? No ?Has the patient traveled outside the [place] where MPX is endemic? No ?Is there a high clinical suspicion for MPX-- evidenced  by one of the following No ? -Unlikely to be chickenpox ? -Lymphadenopathy ? -Rash that present in same phase of evolution on any given body part ?See flowsheet for further details and programmatic requirements.  ? ? ?The following portions of the patient's history were reviewed and updated as appropriate: allergies, current medications, past medical history, past social history, past surgical history and problem list. ? ?Objective:  ?There were no vitals filed for this visit. ? ?Physical Exam ?Constitutional:   ?   Appearance: Normal appearance.  ?HENT:  ?   Head: Normocephalic.  ?   Right Ear: External ear normal.  ?   Left Ear: External ear normal.  ?   Nose: Nose normal.  ?   Mouth/Throat:  ?   Mouth: Mucous membranes are moist.  ?   Comments: No visible signs of dental caries ?Pulmonary:  ?   Effort: Pulmonary effort is normal.  ?Abdominal:  ?   General: Abdomen is flat.  ?   Palpations: Abdomen is soft.  ?Genitourinary: ? ? ?   Comments: External genitalia/pubic area without nits, lice, edema, erythema, and inguinal adenopathy. ?Vagina with normal mucosa and discharge. ?Cervix without visible lesions. ?Uterus firm, mobile, nt, no masses, no CMT, no adnexal tenderness or fullness.  Two small painful lesions noted to right inner labia.   ?Musculoskeletal:  ?   Cervical back: Full passive range of motion without pain, normal range of motion and neck supple.  ?Neurological:  ?   Mental Status: She is alert.  ?Psychiatric:     ?   Behavior: Behavior is cooperative.  ? ? ? ?Assessment and Plan:  ?  Sarina Fout Montesdeoca Jerilee Hoh is a 26 y.o. female presenting to the [facility] Department for STI screening ? ?1. Screening examination for venereal disease ?-26 year old female in clinic today for STD screening. ?-Patient reports multiple signs and symptoms that were consistent for one week.  Two small lesions noted to right inner labia, tender to touch.  HSV culture obtained of two lesions.  ?-Patient  accepted all screenings including oral, vaginal CT/GC and bloodwork for HIV/RPR.  ?Patient meets criteria for HepB screening? Yes. Ordered? Yes ?Patient meets criteria for HepC screening? Yes. Ordered? Yes ? ?Treat wet prep per standing order ?Discussed time line for [facility] results and that patient will be called with positive results and encouraged patient to call if she had not heard in 2 weeks.  ?Counseled to return or seek care for continued or worsening symptoms ?Recommended condom use with all sex ? ?Patient is currently using  condoms   to prevent pregnancy.   ? ?- WET PREP FOR TRICH, YEAST, CLUE ?- Chlamydia/Gonorrhea [place] [facility] ?- HIV/HCV [place] [facility] ?- Syphilis Serology, [place] [facility] ?- HBV Antigen/Antibody [facility] ?- Virology, [place] [facility]  ? ? ? ? ?Return if symptoms worsen or fail to improve. ? ?No future appointments. ? ?Gregary Cromer, FNP ? ?

## 2021-11-11 ENCOUNTER — Telehealth: Payer: Self-pay

## 2021-11-11 NOTE — Telephone Encounter (Signed)
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 ?Offer provider appt to discuss tx options. ?

## 2021-11-12 NOTE — Telephone Encounter (Signed)
Phone call to pt at [phone]. Pt confirmed password fro last visit.  Counseled pt regarding positive HSV-2 results. Answered general questions. Pt does desire to see provider and also wishes to discuss possible tx options.  Due to pt's work schedule and pt request, provider appt scheduled for 11/20/21. ?

## 2021-11-19 ENCOUNTER — Emergency Department
Admission: EM | Admit: 2021-11-19 | Discharge: 2021-11-19 | Disposition: A | Discharge status: Home / Self Care | Payer: Medicaid Other | Attending: Emergency Medicine | Admitting: Emergency Medicine

## 2021-11-19 ENCOUNTER — Other Ambulatory Visit: Payer: Self-pay

## 2021-11-19 ENCOUNTER — Emergency Department: Payer: Medicaid Other

## 2021-11-19 DIAGNOSIS — R102 Pelvic and perineal pain: Secondary | ICD-10-CM | POA: Diagnosis not present

## 2021-11-19 DIAGNOSIS — N939 Abnormal uterine and vaginal bleeding, unspecified: Secondary | ICD-10-CM | POA: Diagnosis not present

## 2021-11-19 DIAGNOSIS — Z3A01 Less than 8 weeks gestation of pregnancy: Secondary | ICD-10-CM | POA: Diagnosis not present

## 2021-11-19 DIAGNOSIS — N9489 Other specified conditions associated with female genital organs and menstrual cycle: Secondary | ICD-10-CM | POA: Diagnosis not present

## 2021-11-19 DIAGNOSIS — O209 Hemorrhage in early pregnancy, unspecified: Secondary | ICD-10-CM | POA: Diagnosis not present

## 2021-11-19 LAB — CBC WITH DIFFERENTIAL/PLATELET
Abs Immature Granulocytes: 0.03 10*3/uL (ref 0.00–0.07)
Basophils Absolute: 0 10*3/uL (ref 0.0–0.1)
Basophils Relative: 0 %
Eosinophils Absolute: 0.2 10*3/uL (ref 0.0–0.5)
Eosinophils Relative: 2 %
HCT: 37.4 % (ref 36.0–46.0)
Hemoglobin: 12.1 g/dL (ref 12.0–15.0)
Immature Granulocytes: 0 %
Lymphocytes Relative: 33 %
Lymphs Abs: 3 10*3/uL (ref 0.7–4.0)
MCH: 27.7 pg (ref 26.0–34.0)
MCHC: 32.4 g/dL (ref 30.0–36.0)
MCV: 85.6 fL (ref 80.0–100.0)
Monocytes Absolute: 0.5 10*3/uL (ref 0.1–1.0)
Monocytes Relative: 5 %
Neutro Abs: 5.3 10*3/uL (ref 1.7–7.7)
Neutrophils Relative %: 60 %
Platelets: 288 10*3/uL (ref 150–400)
RBC: 4.37 MIL/uL (ref 3.87–5.11)
RDW: 12.3 % (ref 11.5–15.5)
WBC: 9 10*3/uL (ref 4.0–10.5)
nRBC: 0 % (ref 0.0–0.2)

## 2021-11-19 LAB — COMPREHENSIVE METABOLIC PANEL
ALT: 40 U/L (ref 0–44)
AST: 26 U/L (ref 15–41)
Albumin: 3.8 g/dL (ref 3.5–5.0)
Alkaline Phosphatase: 72 U/L (ref 38–126)
Anion gap: 9 (ref 5–15)
BUN: 9 mg/dL (ref 6–20)
CO2: 22 mmol/L (ref 22–32)
Calcium: 9 mg/dL (ref 8.9–10.3)
Chloride: 107 mmol/L (ref 98–111)
Creatinine, Ser: 0.47 mg/dL (ref 0.44–1.00)
GFR, Estimated: >60 mL/min (ref >60)
Glucose, Bld: 90 mg/dL (ref 70–99)
Potassium: 3.3 mmol/L — ABNORMAL LOW (ref 3.5–5.1)
Sodium: 138 mmol/L (ref 135–145)
Total Bilirubin: 0.7 mg/dL (ref 0.3–1.2)
Total Protein: 8.3 g/dL — ABNORMAL HIGH (ref 6.5–8.1)

## 2021-11-19 LAB — CHLAMYDIA/NGC RT PCR (ARMC ONLY)
Chlamydia Tr: NOT DETECTED
N gonorrhoeae: NOT DETECTED

## 2021-11-19 LAB — URINALYSIS, ROUTINE W REFLEX MICROSCOPIC
RBC / HPF: >50 RBC/hpf — ABNORMAL HIGH (ref 0–5)
Specific Gravity, Urine: 1.029 (ref 1.005–1.030)
WBC, UA: >50 WBC/hpf — ABNORMAL HIGH (ref 0–5)

## 2021-11-19 LAB — HCG, QUANTITATIVE, PREGNANCY: hCG, Beta Chain, Quant, S: <1 m[IU]/mL (ref <5)

## 2021-11-19 LAB — WET PREP, GENITAL
Clue Cells Wet Prep HPF POC: NONE SEEN
Sperm: NONE SEEN
Trich, Wet Prep: NONE SEEN
Yeast Wet Prep HPF POC: NONE SEEN

## 2021-11-19 LAB — POC URINE PREG, ED: Preg Test, Ur: NEGATIVE

## 2021-11-19 MED ORDER — POTASSIUM CHLORIDE CRYS ER 20 MEQ PO TBCR
40.0000 meq | EXTENDED_RELEASE_TABLET | Freq: Once | ORAL | Status: DC
  Filled 2021-11-19: qty 2

## 2021-11-19 MED ORDER — ACETAMINOPHEN 500 MG PO TABS
1000.0000 mg | ORAL_TABLET | Freq: Once | ORAL | Status: AC
Start: 2021-11-19 — End: 2021-11-19
  Administered 2021-11-19: 1000 mg via ORAL
  Filled 2021-11-19: qty 2

## 2021-11-19 NOTE — ED Triage Notes (Signed)
Pt comes pov with pelvic pain. Unsure if pregnant but LMP end of January. Pain started today.  ?

## 2021-11-19 NOTE — ED Provider Notes (Signed)
? ?[facility] ?Provider Note ? ? ? Event Date/Time  ? First MD Initiated Contact with Patient 11/19/21 1132   ?  (approximate) ? ? ?History  ? ?Pelvic Pain ? ? ?HPI ? ?Dawn Richards is a 26 y.o. female  with heavy vaginal bleeding.  Bleeding started today with some clots noted.  The bleeding has since gotten better.  She reports some nausea and food aversion and therefore concern she could be pregnant. Last menstrual period in Jan- but has irregular periods at baseline. Not on any birth control.  She has had three pregnancy- 1 vaginal delivery 2 years ago. 2 miscarriages.  She reports some mild suprapubic pain. No urinary symptoms.  Reports severe pain on the bilateral sides.  Denies ever happening previously even with her history of irregular periods. ? ?  ? ? ?Physical Exam  ? ?Triage Vital Signs: ?ED Triage Vitals  ?Enc Vitals Group  ?   BP 11/19/21 1131 139/85  ?   Pulse Rate 11/19/21 1131 77  ?   Resp 11/19/21 1131 16  ?   Temp 11/19/21 1131 98.1 ?F (36.7 ?C)  ?   Temp Source 11/19/21 1131 Oral  ?   SpO2 11/19/21 1131 96 %  ?   Weight 11/19/21 1128 200 lb (90.7 kg)  ?   Height 11/19/21 1128 5' (1.524 m)  ?   Head Circumference --   ?   Peak Flow --   ?   Pain Score 11/19/21 1128 5  ?   Pain Loc --   ?   Pain Edu? --   ?   Excl. in GC? --   ? ? ?Most recent vital signs: ?Vitals:  ? 11/19/21 1131  ?BP: 139/85  ?Pulse: 77  ?Resp: 16  ?Temp: 98.1 ?F (36.7 ?C)  ?SpO2: 96%  ? ? ? ?General: Awake, no distress.  ?CV:  Good peripheral perfusion.  ?Resp:  Normal effort.  ?Abd:  No distention.  Tender in the lower abdomen ?Other:   ? ? ?ED Results / Procedures / Treatments  ? ?Labs ?(all labs ordered are listed, but only abnormal results are displayed) ?Labs Reviewed  ?WET PREP, GENITAL - Abnormal; Notable for the following components:  ?    Result Value  ? WBC, Wet Prep HPF POC RARE (*)   ? All other components within normal limits  ?URINALYSIS, ROUTINE W REFLEX MICROSCOPIC -  Abnormal; Notable for the following components:  ? Color, Urine RED (*)   ? APPearance CLOUDY (*)   ? Glucose, UA   (*)   ? Value: TEST NOT REPORTED DUE TO COLOR INTERFERENCE OF URINE PIGMENT  ? Hgb urine dipstick   (*)   ? Value: TEST NOT REPORTED DUE TO COLOR INTERFERENCE OF URINE PIGMENT  ? Bilirubin Urine   (*)   ? Value: TEST NOT REPORTED DUE TO COLOR INTERFERENCE OF URINE PIGMENT  ? Ketones, ur   (*)   ? Value: TEST NOT REPORTED DUE TO COLOR INTERFERENCE OF URINE PIGMENT  ? Protein, ur   (*)   ? Value: TEST NOT REPORTED DUE TO COLOR INTERFERENCE OF URINE PIGMENT  ? Nitrite   (*)   ? Value: TEST NOT REPORTED DUE TO COLOR INTERFERENCE OF URINE PIGMENT  ? Leukocytes,Ua   (*)   ? Value: TEST NOT REPORTED DUE TO COLOR INTERFERENCE OF URINE PIGMENT  ? RBC / HPF >50 (*)   ? WBC, UA >50 (*)   ? Bacteria,  UA RARE (*)   ? All other components within normal limits  ?CHLAMYDIA/NGC RT PCR ([facility] ONLY)            ?POC URINE PREG, ED  ? ? ? ?RADIOLOGY ?I have reviewed the [place] my read is negative ? ? ? ?PROCEDURES: ? ?Critical Care performed: No ? ?Procedures ? ? ?MEDICATIONS ORDERED IN ED: ?Medications  ?potassium chloride SA (KLOR-CON M) CR tablet 40 mEq (has no administration in time range)  ?acetaminophen (TYLENOL) tablet 1,000 mg (1,000 mg Oral Given 11/19/21 1433)  ? ? ? ?IMPRESSION / MDM / ASSESSMENT AND PLAN / ED COURSE  ?I reviewed the triage vital signs and the nursing notes. ?             ?               ?Suspect miscarriage versus ectopic versus UTI versus STD versus.  Given the significant lower abdominal pain will get ultrasound to make sure no large fibroid, torsion, cyst rupture. ? ? ?Preg test to rule out ectopic- this was negative ?WET prep/std to rule out PID but seems less likely. ?UA difficult to tell due to lots of blood.  ? ?Transvaginal was normal.  Labs show slightly low potassium.  CBC stable.  hCG negative ? ?Went to go update patient and she was not in the room. ? ? ? ?Clinical Course as of 11/19/21  1539  ?Tue Nov 19, 2021  ?1220 POC Urine Pregnancy, ED [DB]  ?  ?Clinical Course User Index ?[DB] Remi Deter, Dorreen, Student-PA  ? ? ? ?FINAL CLINICAL IMPRESSION(S) / ED DIAGNOSES  ? ?Final diagnoses:  ?Vaginal bleeding  ? ? ? ?Rx / DC Orders  ? ?ED Discharge Orders   ? ? None  ? ?  ? ? ? ?Note:  This document was prepared using Dragon voice recognition software and may include unintentional dictation errors. ?  ?Concha Se, MD ?11/19/21 1539 ? ?

## 2021-11-20 ENCOUNTER — Encounter: Payer: Self-pay | Admitting: Family Medicine

## 2021-11-20 ENCOUNTER — Ambulatory Visit: Payer: Medicaid Other

## 2021-11-20 ENCOUNTER — Ambulatory Visit: Payer: Medicaid Other | Admitting: Family Medicine

## 2021-11-20 DIAGNOSIS — Z113 Encounter for screening for infections with a predominantly sexual mode of transmission: Secondary | ICD-10-CM | POA: Diagnosis not present

## 2021-11-20 DIAGNOSIS — A6000 Herpesviral infection of urogenital system, unspecified: Secondary | ICD-10-CM

## 2021-11-20 MED ORDER — ACYCLOVIR 800 MG PO TABS: 800.0000 mg | ORAL_TABLET | Freq: Every day | ORAL | 11 refills | Status: DC

## 2021-11-20 NOTE — Progress Notes (Signed)
S: pt in clinic for treatment options for + HSV ? ?O: + HSV 2 11/06/21  ? ?A: 1. Genital herpes simplex type 2 ?- acyclovir (ZOVIRAX) 800 MG tablet; Take 1 tablet (800 mg total) by mouth 5 (five) times daily.  Dispense: 30 tablet; Refill: 11 ? ?P: -Pt was counseled regarding HSV disease, transmission, cyclic nature, subclinical disease and treatment options.  ?-Pt desires option for suppressive treatment.  ?-Questions and concerns addressed. Patient verbalized understanding of results.  ?-Requests Acyclovir be called into listed pharmacy.  ?- Pt informed of need for annual follow up to continue medications.   ?-spent 20 mins face to face time with patient d/t patient answering phone during visit.   ? ?Wendi Snipes, FNP ? ?

## 2021-11-21 LAB — URINE CULTURE

## 2022-02-24 ENCOUNTER — Emergency Department
Admission: EM | Admit: 2022-02-24 | Discharge: 2022-02-24 | Disposition: A | Discharge status: Home / Self Care | Payer: Medicaid Other | Attending: Emergency Medicine | Admitting: Emergency Medicine

## 2022-02-24 ENCOUNTER — Other Ambulatory Visit: Payer: Self-pay

## 2022-02-24 DIAGNOSIS — S199XXA Unspecified injury of neck, initial encounter: Secondary | ICD-10-CM | POA: Diagnosis present

## 2022-02-24 DIAGNOSIS — S161XXA Strain of muscle, fascia and tendon at neck level, initial encounter: Secondary | ICD-10-CM | POA: Insufficient documentation

## 2022-02-24 DIAGNOSIS — G4489 Other headache syndrome: Secondary | ICD-10-CM | POA: Diagnosis not present

## 2022-02-24 DIAGNOSIS — Y92410 Unspecified street and highway as the place of occurrence of the external cause: Secondary | ICD-10-CM | POA: Diagnosis not present

## 2022-02-24 DIAGNOSIS — R519 Headache, unspecified: Secondary | ICD-10-CM | POA: Diagnosis not present

## 2022-02-24 DIAGNOSIS — G8911 Acute pain due to trauma: Secondary | ICD-10-CM | POA: Diagnosis not present

## 2022-02-24 MED ORDER — IBUPROFEN 400 MG PO TABS: 400.0000 mg | ORAL_TABLET | Freq: Three times a day (TID) | ORAL | 0 refills | Status: AC | PRN

## 2022-02-24 MED ORDER — IBUPROFEN 400 MG PO TABS
400.0000 mg | ORAL_TABLET | Freq: Once | ORAL | Status: AC
  Administered 2022-02-24: 400 mg via ORAL
  Filled 2022-02-24: qty 1

## 2022-02-24 NOTE — ED Notes (Signed)
Pt states she doesn't want  to be seen and is leaving.
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
## 2022-02-24 NOTE — ED Notes (Signed)
Pt now back in lobby and states she changed her mind.
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
## 2022-05-06 ENCOUNTER — Telehealth: Payer: Self-pay | Admitting: Obstetrics & Gynecology

## 2022-05-06 NOTE — Telephone Encounter (Signed)
Patient is scheduled to see CJE on 05/14/22 at 10;55 am. Left message for patient to call office back to reschedule.

## 2022-05-14 ENCOUNTER — Ambulatory Visit (INDEPENDENT_AMBULATORY_CARE_PROVIDER_SITE_OTHER): Payer: Medicaid Other | Admitting: Obstetrics & Gynecology

## 2022-05-14 ENCOUNTER — Encounter: Payer: Self-pay | Admitting: Obstetrics & Gynecology

## 2022-05-14 ENCOUNTER — Other Ambulatory Visit (HOSPITAL_COMMUNITY)
Admission: RE | Admit: 2022-05-14 | Discharge: 2022-05-14 | Disposition: A | Discharge status: Home / Self Care | Payer: Medicaid Other | Source: Ambulatory Visit | Attending: Obstetrics & Gynecology | Admitting: Obstetrics & Gynecology

## 2022-05-14 VITALS — BP 124/72 | Resp 16 | Ht 62.0 in | Wt 212.4 lb

## 2022-05-14 DIAGNOSIS — Z3202 Encounter for pregnancy test, result negative: Secondary | ICD-10-CM | POA: Diagnosis not present

## 2022-05-14 DIAGNOSIS — Z113 Encounter for screening for infections with a predominantly sexual mode of transmission: Secondary | ICD-10-CM | POA: Diagnosis not present

## 2022-05-14 DIAGNOSIS — Z01419 Encounter for gynecological examination (general) (routine) without abnormal findings: Secondary | ICD-10-CM | POA: Diagnosis not present

## 2022-05-14 DIAGNOSIS — Z124 Encounter for screening for malignant neoplasm of cervix: Secondary | ICD-10-CM | POA: Insufficient documentation

## 2022-05-14 DIAGNOSIS — Z20828 Contact with and (suspected) exposure to other viral communicable diseases: Secondary | ICD-10-CM | POA: Diagnosis not present

## 2022-05-14 DIAGNOSIS — Z7251 High risk heterosexual behavior: Secondary | ICD-10-CM | POA: Diagnosis not present

## 2022-05-14 LAB — POCT URINE PREGNANCY: Preg Test, Ur: NEGATIVE
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
## 2022-05-15 LAB — CERVICOVAGINAL ANCILLARY ONLY
Bacterial Vaginitis (gardnerella): POSITIVE — AB
Candida Glabrata: NEGATIVE
Candida Vaginitis: NEGATIVE
Chlamydia: NEGATIVE
Comment: NEGATIVE
Comment: NEGATIVE
Comment: NEGATIVE
Comment: NEGATIVE
Comment: NEGATIVE
Comment: NORMAL
Neisseria Gonorrhea: NEGATIVE
Trichomonas: NEGATIVE

## 2022-05-15 LAB — HEP, RPR, HIV PANEL
HIV Screen 4th Generation wRfx: NONREACTIVE
Hepatitis B Surface Ag: NEGATIVE
RPR Ser Ql: NONREACTIVE

## 2022-05-15 LAB — HSV 1 AND 2 AB, IGG
HSV 1 Glycoprotein G Ab, IgG: <0.91 index (ref 0.00–0.90)
HSV 2 IgG, Type Spec: 9.86 index — ABNORMAL HIGH (ref 0.00–0.90)

## 2022-05-15 NOTE — Telephone Encounter (Signed)
Patient seen 05/14/22 with CJE

## 2022-05-16 LAB — CYTOLOGY - PAP
Chlamydia: NEGATIVE
Comment: NEGATIVE
Comment: NEGATIVE
Comment: NORMAL
Diagnosis: NEGATIVE
Neisseria Gonorrhea: NEGATIVE
Trichomonas: NEGATIVE

## 2022-05-29 ENCOUNTER — Ambulatory Visit (INDEPENDENT_AMBULATORY_CARE_PROVIDER_SITE_OTHER): Payer: Medicaid Other

## 2022-05-29 VITALS — BP 140/83 | HR 113 | Ht 62.0 in | Wt 212.0 lb

## 2022-05-29 DIAGNOSIS — Z3200 Encounter for pregnancy test, result unknown: Secondary | ICD-10-CM

## 2022-05-29 DIAGNOSIS — Z3201 Encounter for pregnancy test, result positive: Secondary | ICD-10-CM

## 2022-05-29 LAB — POCT URINE PREGNANCY: Preg Test, Ur: POSITIVE — AB

## 2022-05-29 NOTE — Progress Notes (Signed)
Subjective:    Dawn Richards is a 26 y.o. female who presents for evaluation of amenorrhea. She believes she could be pregnant. Pregnancy is desired.  Last period was normal.   Patient's last menstrual period was 04/12/2022 (exact date). The following portions of the patient's history were reviewed and updated as appropriate: allergies, current medications, past family history, past medical history, past social history, past surgical history, and problem list.   Lab Review Urine HCG: positive    Assessment:    Absence of menstruation.     Plan:    Pregnancy Test:  Positive: EDC: 01/17/23. Briefly discussed positive results and sent to check out for scheduling for New OB appointments.

## 2022-06-16 ENCOUNTER — Ambulatory Visit: Payer: Medicaid Other

## 2022-06-18 ENCOUNTER — Ambulatory Visit (INDEPENDENT_AMBULATORY_CARE_PROVIDER_SITE_OTHER): Payer: Medicaid Other

## 2022-06-18 DIAGNOSIS — Z3689 Encounter for other specified antenatal screening: Secondary | ICD-10-CM

## 2022-06-18 DIAGNOSIS — Z369 Encounter for antenatal screening, unspecified: Secondary | ICD-10-CM

## 2022-06-18 DIAGNOSIS — Z3480 Encounter for supervision of other normal pregnancy, unspecified trimester: Secondary | ICD-10-CM | POA: Insufficient documentation

## 2022-06-18 NOTE — Progress Notes (Signed)
New OB Intake  I connected with  Dawn Richards on 06/18/22 at 10:15 AM EST by telephone and verified that I am speaking with the correct person using two identifiers. Nurse is located at [facility] and pt is located in care.  I explained I am completing New OB Intake today. We discussed her EDD of 01/17/2023 that is based on LMP of 04/12/2022. Pt is G2/P1001. I reviewed her allergies, medications, Medical/Surgical/OB history, and appropriate screenings. Based on history, this is a/an pregnancy uncomplicated .   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  Anatomy US Explained first scheduled [place] will be scheduled soon and an anatomy will be scheduled at 20 weeks.  Labs Discussed genetic screening with patient. Patient declines genetic testing.  Discussed possible labs to be drawn at new OB appointment.  COVID Vaccine Patient has not had COVID vaccine.   Social Determinants of Health Food Insecurity: expresses food insecurity. Information given on local food banks. WIC Referral: Patient is interested in referral to [facility].   Transportation: Patient denies transportation needs. Childcare: Discussed no children allowed at ultrasound appointments.   First visit review I reviewed new OB appt with pt. I explained she will have ob bloodwork and pap smear/pelvic exam if indicated. Explained pt will be seen by Tresea Mall, CNM at first visit; encounter routed to appropriate provider.   Loran Senters, [facility] 06/18/2022  10:43 AM

## 2022-06-23 ENCOUNTER — Other Ambulatory Visit: Payer: Medicaid Other

## 2022-06-23 DIAGNOSIS — Z369 Encounter for antenatal screening, unspecified: Secondary | ICD-10-CM | POA: Diagnosis not present

## 2022-06-23 DIAGNOSIS — Z3480 Encounter for supervision of other normal pregnancy, unspecified trimester: Secondary | ICD-10-CM | POA: Diagnosis not present

## 2022-06-24 LAB — CBC/D/PLT+RPR+RH+ABO+RUBIGG...
Antibody Screen: NEGATIVE
Basophils Absolute: 0 10*3/uL (ref 0.0–0.2)
Basos: 0 %
EOS (ABSOLUTE): 0.1 10*3/uL (ref 0.0–0.4)
Eos: 1 %
HCV Ab: NONREACTIVE
HIV Screen 4th Generation wRfx: NONREACTIVE
Hematocrit: 35.4 % (ref 34.0–46.6)
Hemoglobin: 11.8 g/dL (ref 11.1–15.9)
Hepatitis B Surface Ag: NEGATIVE
Immature Grans (Abs): 0 10*3/uL (ref 0.0–0.1)
Immature Granulocytes: 0 %
Lymphocytes Absolute: 2 10*3/uL (ref 0.7–3.1)
Lymphs: 24 %
MCH: 28.6 pg (ref 26.6–33.0)
MCHC: 33.3 g/dL (ref 31.5–35.7)
MCV: 86 fL (ref 79–97)
Monocytes Absolute: 0.4 10*3/uL (ref 0.1–0.9)
Monocytes: 5 %
Neutrophils Absolute: 5.7 10*3/uL (ref 1.4–7.0)
Neutrophils: 70 %
Platelets: 293 10*3/uL (ref 150–450)
RBC: 4.12 x10E6/uL (ref 3.77–5.28)
RDW: 11.9 % (ref 11.7–15.4)
RPR Ser Ql: NONREACTIVE
Rh Factor: POSITIVE
Rubella Antibodies, IGG: <0.9 index — ABNORMAL LOW (ref >0.99)
Varicella zoster IgG: 683 index (ref >165)
WBC: 8.2 10*3/uL (ref 3.4–10.8)

## 2022-06-24 LAB — HCV INTERPRETATION

## 2022-06-25 ENCOUNTER — Ambulatory Visit (INDEPENDENT_AMBULATORY_CARE_PROVIDER_SITE_OTHER): Payer: Medicaid Other

## 2022-06-25 DIAGNOSIS — Z3480 Encounter for supervision of other normal pregnancy, unspecified trimester: Secondary | ICD-10-CM

## 2022-06-25 DIAGNOSIS — Z3687 Encounter for antenatal screening for uncertain dates: Secondary | ICD-10-CM

## 2022-06-25 DIAGNOSIS — Z3A09 9 weeks gestation of pregnancy: Secondary | ICD-10-CM

## 2022-06-25 DIAGNOSIS — Z369 Encounter for antenatal screening, unspecified: Secondary | ICD-10-CM

## 2022-06-26 IMAGING — US US PELVIS COMPLETE TRANSABD/TRANSVAG W DUPLEX AND/OR DOPPLER
1 series · 13 of 25 positions shown · non-contrast
Comparison: Pelvic MRI 08/29/2019, CT abdomen/pelvis 01/02/2016
prior obstetric ultrasounds

CLINICAL DATA: Vaginal bleeding

EXAM:
TRANSABDOMINAL AND TRANSVAGINAL ULTRASOUND OF PELVIS
DOPPLER ULTRASOUND OF OVARIES
TECHNIQUE: Both transabdominal and transvaginal ultrasound examinations of the
pelvis were performed. Transabdominal technique was performed for
global imaging of the pelvis including uterus, ovaries, adnexal
regions, and pelvic cul-de-sac.
It was necessary to proceed with endovaginal exam following the
transabdominal exam to visualize the uterus and ovaries. Color and
duplex Doppler ultrasound was utilized to evaluate blood flow to the
ovaries.

[Series 1: us pelvic complete w transvaginal and torsion righ · 13 of 44 slices shown]
[im 1/44]
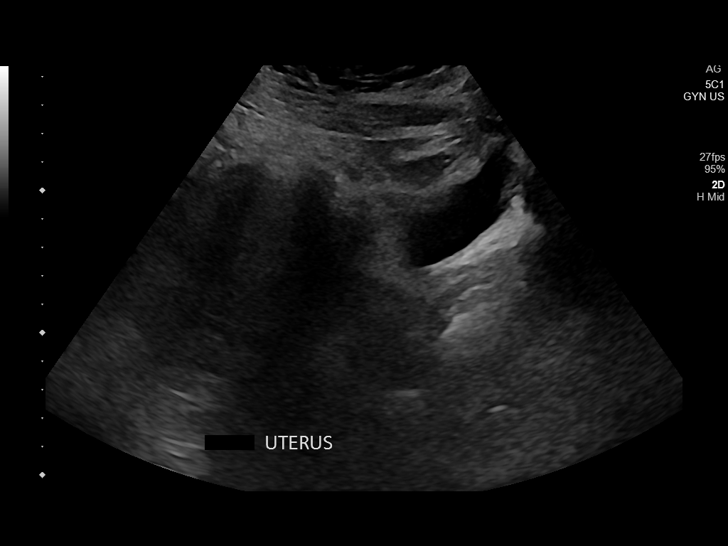
[im 4/44]
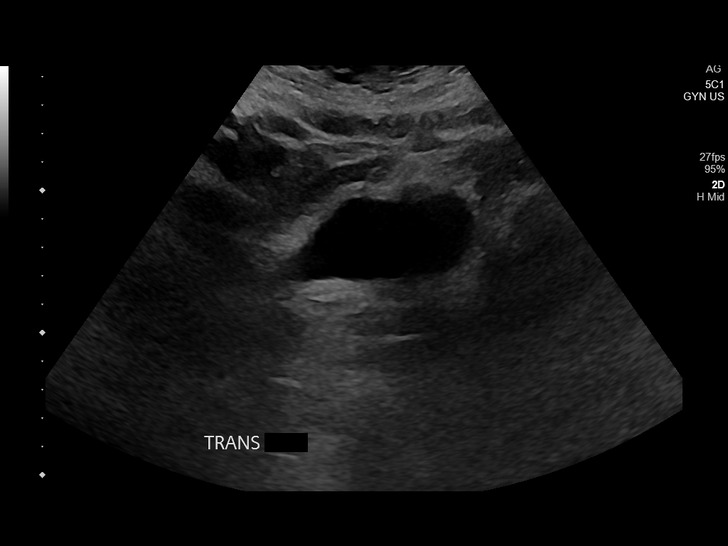
[im 8/44]
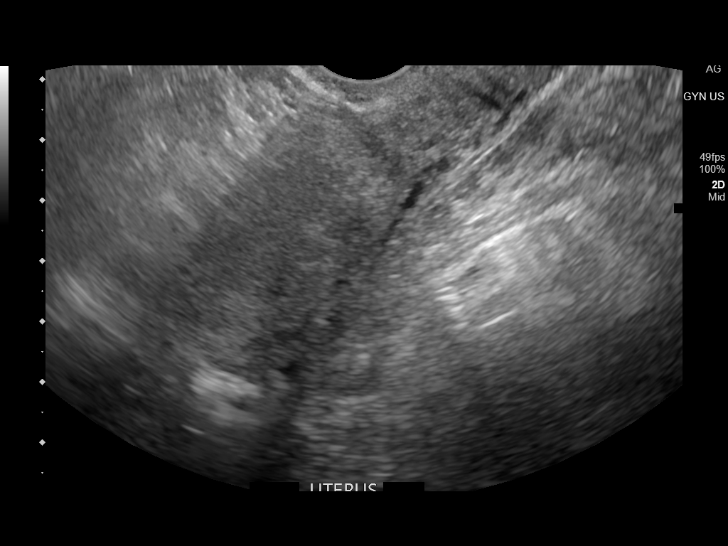
[im 11/44]
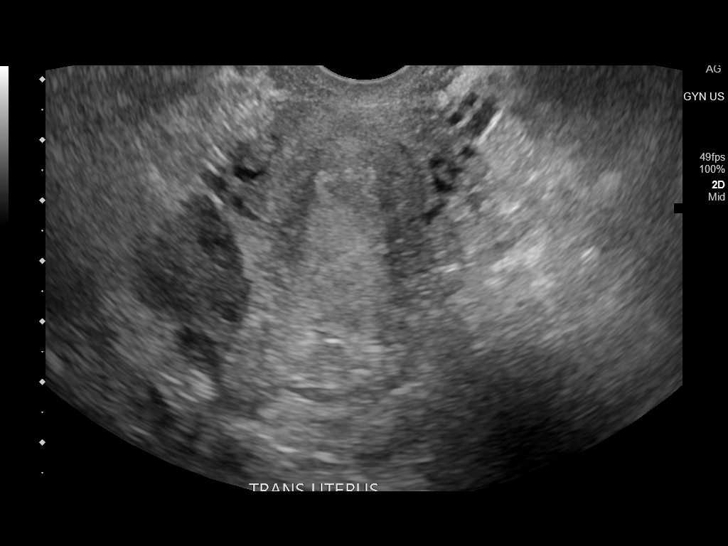
[im 15/44]
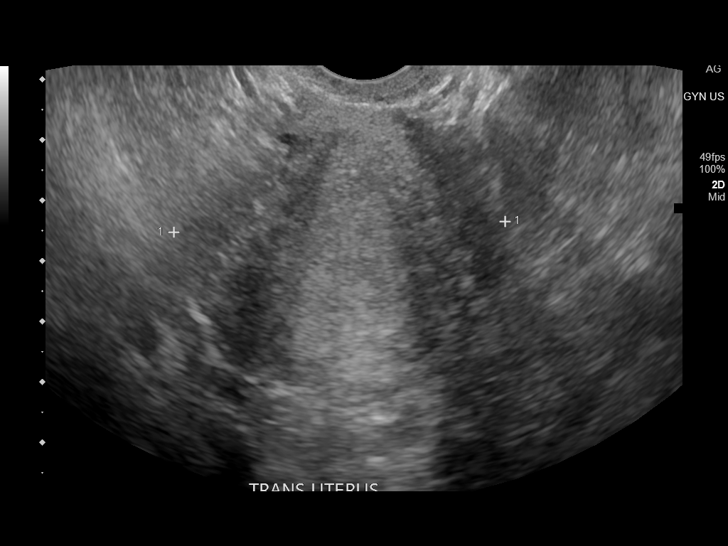
[im 18/44]
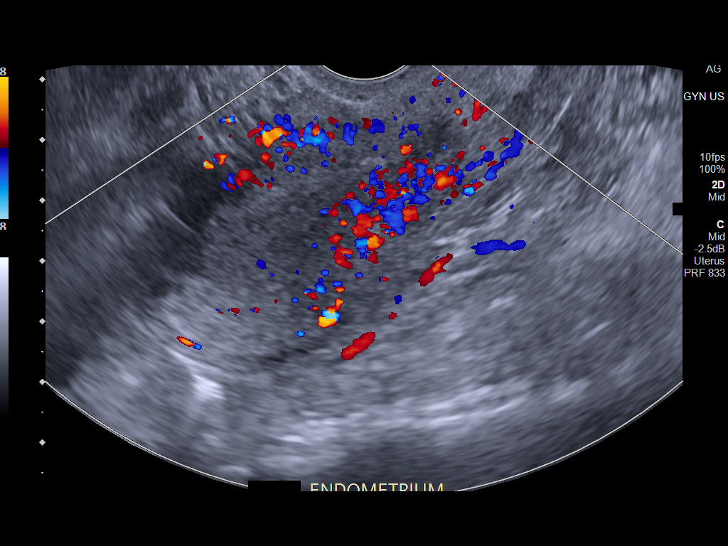
[im 22/44]
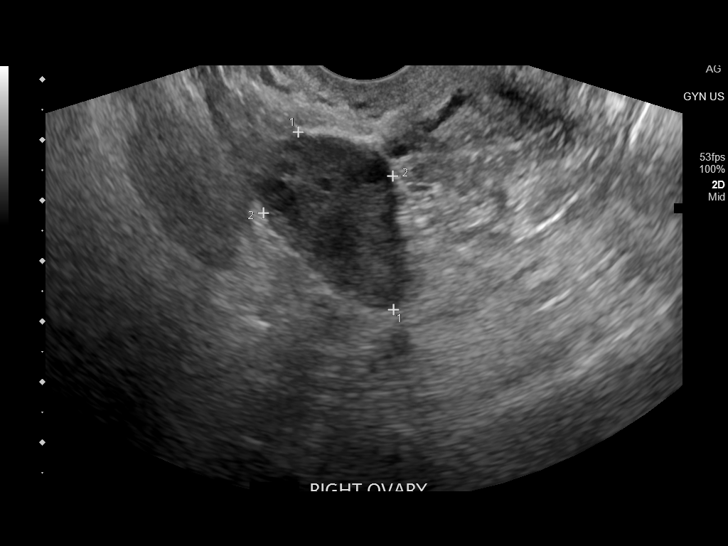
[im 26/44]
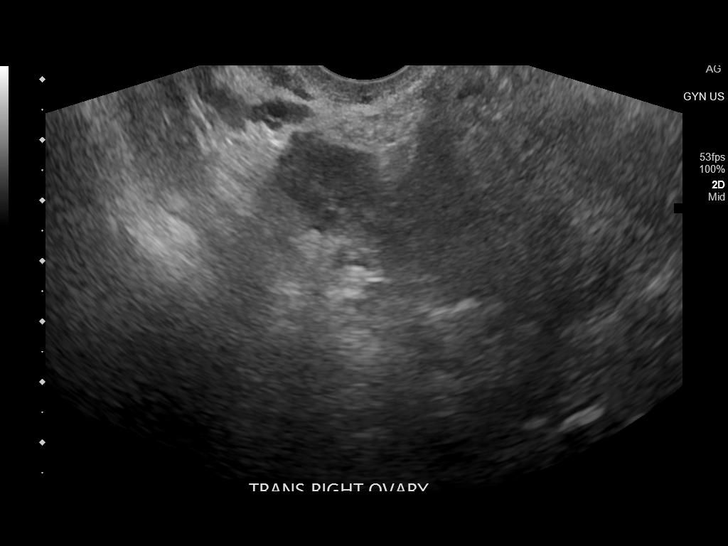
[im 29/44]
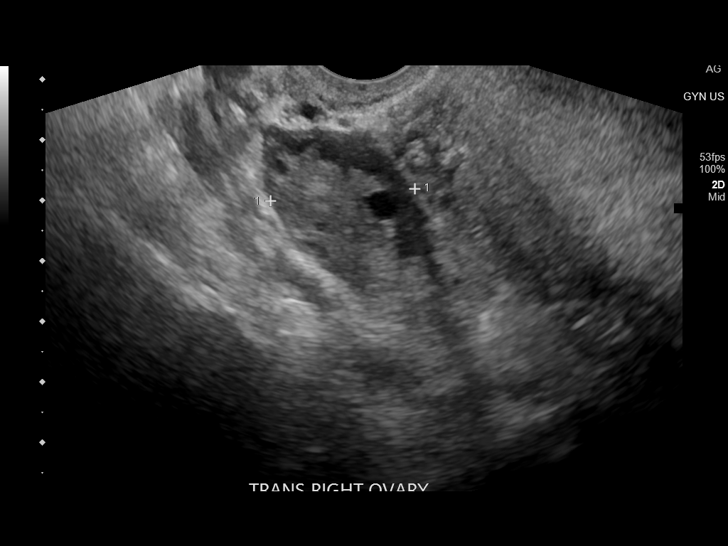
[im 33/44]
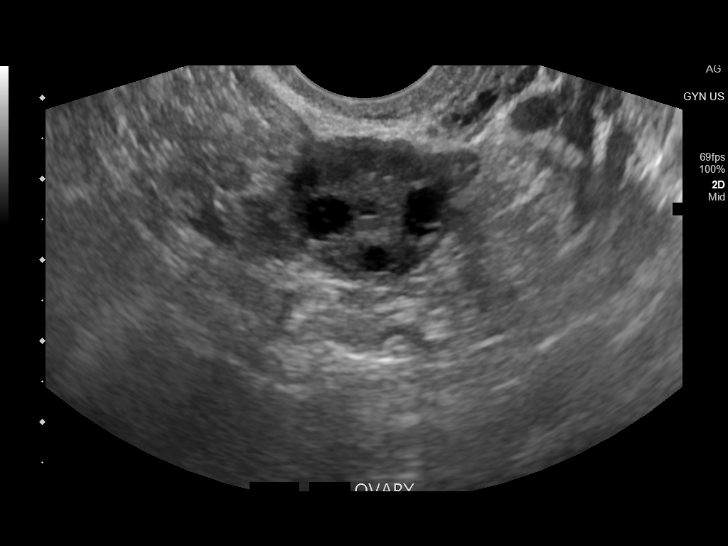
[im 36/44]
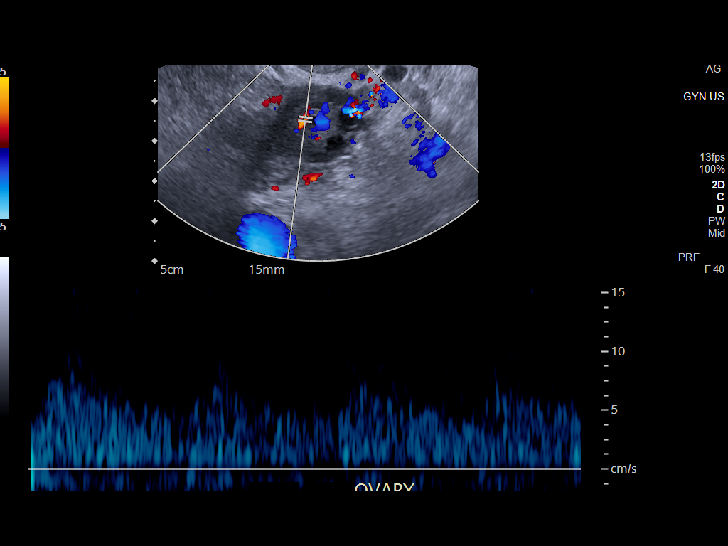
[im 40/44]
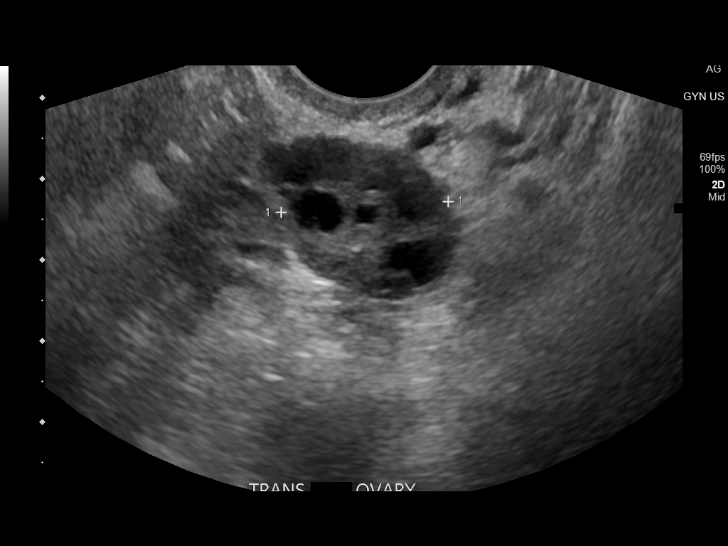
[im 44/44]
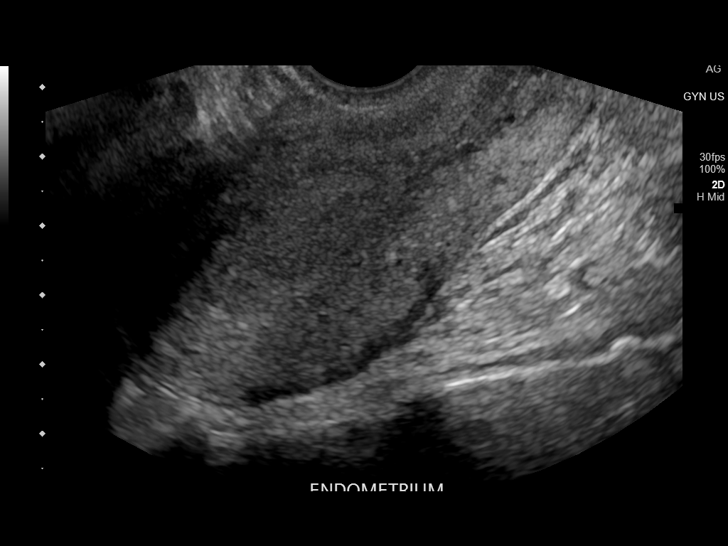

[13 of 25 positions shown; findings below may reference images not displayed]

FINDINGS: Uterus

Measurements: 9.5 cm x 4.4 cm x 5.5 cm = volume: 118 mL. No fibroids
or other mass visualized.

Endometrium

Thickness: 17 mm.  No focal abnormality visualized.

Right ovary

Measurements: 3.3 cm x 2.2 cm x 2.4 cm = volume: 9 mL. Normal
appearance/no adnexal mass.

Left ovary

Measurements: 2.5 cm x 2.0 cm x 2.1 cm = volume: 5 mL. Normal
appearance/no adnexal mass.

Pulsed Doppler evaluation of both ovaries demonstrates normal
low-resistance arterial and venous waveforms.

Other findings

No abnormal free fluid.
IMPRESSION: Normal pelvic ultrasound. If bleeding remains unresponsive to
hormonal or medical therapy, focal lesion work-up with
sonohysterogram should be considered. Endometrial biopsy should also
be considered in pre-menopausal patients at high risk for
endometrial carcinoma. (Ref: Radiological Reasoning: Algorithmic
Workup of Abnormal Vaginal Bleeding with Endovaginal Sonography and
Sonohysterography. AJR [id number]; 191:S68-73)

## 2022-06-30 ENCOUNTER — Telehealth: Payer: Self-pay | Admitting: Advanced Practice Midwife

## 2022-06-30 ENCOUNTER — Encounter: Payer: Medicaid Other | Admitting: Advanced Practice Midwife

## 2022-06-30 NOTE — Telephone Encounter (Signed)
Reached out to pt to reschedule NOB appt that was scheduled with JEG appt  06/30/22 at 9:55.  Left message for pt to call back to reschedule.

## 2022-07-01 ENCOUNTER — Encounter: Payer: Self-pay | Admitting: Advanced Practice Midwife

## 2022-07-01 NOTE — Telephone Encounter (Signed)
Reached out to pt (2X) to reschedule NOB appt that was scheduled with JEG on 06/30/22 at 9:55.  Could not leave a message, call could not be completed as dialed.  Will send a Medical laboratory scientific officer.

## 2022-07-10 NOTE — Telephone Encounter (Signed)
Patient is scheduled for 08/01/22 with DJE

## 2022-08-01 ENCOUNTER — Telehealth: Payer: Self-pay | Admitting: Obstetrics and Gynecology

## 2022-08-01 ENCOUNTER — Encounter: Payer: Medicaid Other | Admitting: Obstetrics and Gynecology

## 2022-08-01 DIAGNOSIS — Z3482 Encounter for supervision of other normal pregnancy, second trimester: Secondary | ICD-10-CM

## 2022-08-01 DIAGNOSIS — Z3A14 14 weeks gestation of pregnancy: Secondary | ICD-10-CM

## 2022-08-01 NOTE — Telephone Encounter (Signed)
Reached out to pt to reschedule NOB appt that was scheduled with Dr. Logan Bores on 08/01/2022 at 10:00.  Call could not be completed.

## 2022-08-05 NOTE — Telephone Encounter (Signed)
I attempt to reach patient via phone. Call couldn't be completed as dialed.

## 2022-08-06 ENCOUNTER — Encounter: Payer: Self-pay | Admitting: Obstetrics and Gynecology

## 2022-08-06 NOTE — Telephone Encounter (Signed)
Will send a MyChart letter concerning appt.

## 2022-08-11 NOTE — L&D Delivery Note (Signed)
Delivery Note   Dawn Richards is a 27 y.o. G2P1001 at [redacted]w[redacted]d Estimated Date of Delivery: 01/24/23  PRE-OPERATIVE DIAGNOSIS:   [redacted]w[redacted]d pregnancy.    POST-OPERATIVE DIAGNOSIS:   [redacted]w[redacted]d pregnancy s/p Vaginal, Spontaneous    Delivery Type: Vaginal, Spontaneous    Delivery Anesthesia: Epidural;Local   Labor Complications: none    ESTIMATED BLOOD LOSS:100 ml    FINDINGS:   1) Female infant, "Ka'Zai"  Apgar scores of 8   at 1 minute and 9   at 5 minutes. Birthweight pending.   SPECIMENS:   PLACENTA:   Appearance: Intact    Removal: Spontaneous      Disposition: Per protocol  CORD BLOOD: Not Indicated  DISPOSITION:  Infant left in stable condition in the delivery room, with L&D personnel and mother.  NARRATIVE SUMMARY: Labor course:  Dawn Richards is a G2P1001 at [redacted]w[redacted]d who presented to Labor & Delivery for prolonged latent labor. Her initial cervical exam was 3.5/60/-2. Labor proceeded with augmentation) and she was found to be completely dilated at 1743. With excellent maternal pushing effort, she birthed a viable female infant at 74. There was a nuchal cord. The shoulders were birthed without difficulty. The infant was handed to the father and then placed skin-to-skin with [facility]. The cord was doubly clamped and cut when pulsations ceased. The placenta delivered spontaneously and was noted to be intact with a 3VC. A perineal and vaginal examination was performed. Episiotomy/Lacerations:   Lacerations were repaired with Vicryl suture using local and epidural anesthesia. Dawn Richards tolerated this well. Mother and baby were left in stable condition.   [name], CNM 01/20/2023 7:59 PM

## 2022-08-18 ENCOUNTER — Telehealth: Payer: Self-pay

## 2022-08-18 NOTE — Telephone Encounter (Signed)
Pt needs a call to reschedule her appointment. Please call [phone]

## 2022-08-22 NOTE — Telephone Encounter (Signed)
Pt is scheduled with Lloyd Huger on 08/28/2022 at 2:55.

## 2022-08-28 ENCOUNTER — Encounter: Payer: Medicaid Other | Admitting: Obstetrics

## 2022-08-28 ENCOUNTER — Encounter: Payer: Self-pay | Admitting: Obstetrics & Gynecology

## 2022-09-08 ENCOUNTER — Encounter: Payer: Self-pay | Admitting: Obstetrics & Gynecology

## 2022-09-08 ENCOUNTER — Other Ambulatory Visit (HOSPITAL_COMMUNITY)
Admission: RE | Admit: 2022-09-08 | Discharge: 2022-09-08 | Disposition: A | Discharge status: Home / Self Care | Payer: Medicaid Other | Source: Ambulatory Visit | Attending: Obstetrics | Admitting: Obstetrics

## 2022-09-08 ENCOUNTER — Ambulatory Visit (INDEPENDENT_AMBULATORY_CARE_PROVIDER_SITE_OTHER): Payer: Medicaid Other | Admitting: Obstetrics & Gynecology

## 2022-09-08 VITALS — BP 120/80 | Wt 209.0 lb

## 2022-09-08 DIAGNOSIS — O99210 Obesity complicating pregnancy, unspecified trimester: Secondary | ICD-10-CM | POA: Diagnosis not present

## 2022-09-08 DIAGNOSIS — Z1379 Encounter for other screening for genetic and chromosomal anomalies: Secondary | ICD-10-CM

## 2022-09-08 DIAGNOSIS — Z113 Encounter for screening for infections with a predominantly sexual mode of transmission: Secondary | ICD-10-CM | POA: Insufficient documentation

## 2022-09-08 DIAGNOSIS — Z3689 Encounter for other specified antenatal screening: Secondary | ICD-10-CM

## 2022-09-08 DIAGNOSIS — Z3A20 20 weeks gestation of pregnancy: Secondary | ICD-10-CM

## 2022-09-08 DIAGNOSIS — R319 Hematuria, unspecified: Secondary | ICD-10-CM | POA: Diagnosis not present

## 2022-09-08 DIAGNOSIS — O0932 Supervision of pregnancy with insufficient antenatal care, second trimester: Secondary | ICD-10-CM

## 2022-09-08 DIAGNOSIS — Z23 Encounter for immunization: Secondary | ICD-10-CM | POA: Diagnosis not present

## 2022-09-08 DIAGNOSIS — O09899 Supervision of other high risk pregnancies, unspecified trimester: Secondary | ICD-10-CM

## 2022-09-08 LAB — POCT URINALYSIS DIPSTICK OB
Bilirubin, UA: NEGATIVE
Blood, UA: POSITIVE
Glucose, UA: NEGATIVE
Ketones, UA: NEGATIVE
Leukocytes, UA: NEGATIVE
Nitrite, UA: NEGATIVE
POC,PROTEIN,UA: NEGATIVE
Spec Grav, UA: 1.015 (ref 1.010–1.025)
Urobilinogen, UA: 1 E.U./dL
pH, UA: 7 (ref 5.0–8.0)

## 2022-09-08 NOTE — Progress Notes (Signed)
Subjective:    Dawn Richards is a G2P1001 (27yo daughter) at [redacted]w[redacted]d being seen today for her first obstetrical visit.  Her obstetrical history is significant for obesity. Patient does intend to breast feed. Pregnancy history fully reviewed.  Patient reports no complaints.  Vitals:   09/08/22 1455  BP: 120/80  Weight: 209 lb (94.8 kg)    HISTORY: OB History  Gravida Para Term Preterm AB Living  2 1 1  0 0 1  SAB IAB Ectopic Multiple Live Births  0 0 0 0 1    # Outcome Date GA Lbr Len/2nd Weight Sex Delivery Anes PTL Lv  2 Current           1 Term 01/31/20 [redacted]w[redacted]d / 00:30 7 lb 3.3 oz (3.27 kg) F Vag-Spont None  LIV   Past Medical History:  Diagnosis Date   DUB (dysfunctional uterine bleeding)    Pelvic pain    Past Surgical History:  Procedure Laterality Date   WISDOM TOOTH EXTRACTION     [age]; age [age]   Family History  Problem Relation Age of Onset   Diabetes Mother    Kidney failure Father    Cancer Paternal Grandmother        breast   Breast cancer Neg Hx    Ovarian cancer Neg Hx    Colon cancer Neg Hx    Heart disease Neg Hx      Exam    Uterus:    Gravid, at umbilicus FHR- 161W  Pelvic Exam:  Deferred   Perineum:    Vulva:    Vagina:     pH:    Cervix:    Adnexa:    Bony Pelvis:   System: Breast:     Skin: normal coloration and turgor, no rashes    Neurologic: oriented   Extremities: normal strength, tone, and muscle mass   HEENT PERRLA   Mouth/Teeth mucous membranes moist, pharynx normal without lesions   Neck supple   Cardiovascular: regular rate and rhythm   Respiratory:  appears well, vitals normal, no respiratory distress, acyanotic, normal RR, ear and throat exam is normal, neck free of mass or lymphadenopathy, chest clear, no wheezing, crepitations, rhonchi, normal symmetric air entry   Abdomen: soft, non-tender; bowel sounds normal; no masses,  no organomegaly          Assessment:    Pregnancy: G2P1001 Patient  Active Problem List   Diagnosis Date Noted   Supervision of other normal pregnancy, antepartum 06/18/2022   Genital herpes simplex type 2 11/20/2021   Vaginal delivery    Postpartum care following vaginal delivery 01/31/2020   Oligohydramnios in singleton pregnancy in third trimester 01/30/2020   Indication for care in labor and delivery, antepartum 01/25/2020   Obesity affecting pregnancy 09/05/2019   BMI 36.0-36.9,adult 09/05/2019   Supervision of high risk pregnancy, antepartum 06/22/2019   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 Prenatal vitamins. Problem list reviewed and updated. Genetic Screening discussed and Mat 21 ordered: .  Ultrasound discussed; fetal survey: ordered with MFM due to maternal obesity  Follow up in 4 weeks. H/O HSV 2- will need to start valtrex by 36 weeks CMP and protein/creatinine ratio ordered I rec'd a total weight gain of  20 pounds or less  Emily Filbert 09/08/2022

## 2022-09-08 NOTE — Addendum Note (Signed)
Addended by: Quintella Baton D on: 09/08/2022 03:26 PM   Modules accepted: Orders

## 2022-09-09 LAB — COMPREHENSIVE METABOLIC PANEL
ALT: 3 IU/L (ref 0–32)
AST: 7 IU/L (ref 0–40)
Albumin/Globulin Ratio: 1.4 (ref 1.2–2.2)
Albumin: 3.6 g/dL — ABNORMAL LOW (ref 4.0–5.0)
Alkaline Phosphatase: 58 IU/L (ref 44–121)
BUN/Creatinine Ratio: 11 (ref 9–23)
BUN: 4 mg/dL — ABNORMAL LOW (ref 6–20)
Bilirubin Total: <0.2 mg/dL (ref 0.0–1.2)
CO2: 19 mmol/L — ABNORMAL LOW (ref 20–29)
Calcium: 8.7 mg/dL (ref 8.7–10.2)
Chloride: 105 mmol/L (ref 96–106)
Creatinine, Ser: 0.35 mg/dL — ABNORMAL LOW (ref 0.57–1.00)
Globulin, Total: 2.6 g/dL (ref 1.5–4.5)
Glucose: 76 mg/dL (ref 70–99)
Potassium: 3.7 mmol/L (ref 3.5–5.2)
Sodium: 137 mmol/L (ref 134–144)
Total Protein: 6.2 g/dL (ref 6.0–8.5)
eGFR: 145 mL/min/{1.73_m2} (ref >59)

## 2022-09-09 NOTE — Addendum Note (Signed)
Addended by: Meryl Dare on: 09/09/2022 03:09 PM   Modules accepted: Orders

## 2022-09-10 LAB — PROTEIN / CREATININE RATIO, URINE
Creatinine, Urine: 331.3 mg/dL
Protein, Ur: 33.5 mg/dL
Protein/Creat Ratio: 101 mg/g creat (ref 0–200)

## 2022-09-10 LAB — CERVICOVAGINAL ANCILLARY ONLY
Chlamydia: NEGATIVE
Comment: NEGATIVE
Comment: NORMAL
Neisseria Gonorrhea: NEGATIVE

## 2022-09-10 LAB — URINE CULTURE

## 2022-09-12 LAB — MATERNIT 21 PLUS CORE, BLOOD
Fetal Fraction: 10
Result (T21): NEGATIVE
Trisomy 13 (Patau syndrome): NEGATIVE
Trisomy 18 (Edwards syndrome): NEGATIVE
Trisomy 21 (Down syndrome): NEGATIVE

## 2022-09-19 ENCOUNTER — Ambulatory Visit
Admission: RE | Admit: 2022-09-19 | Discharge: 2022-09-19 | Disposition: A | Discharge status: Home / Self Care | Payer: Medicaid Other | Source: Ambulatory Visit | Attending: Obstetrics & Gynecology | Admitting: Obstetrics & Gynecology

## 2022-09-19 DIAGNOSIS — Z3689 Encounter for other specified antenatal screening: Secondary | ICD-10-CM | POA: Diagnosis present

## 2022-09-19 DIAGNOSIS — Z3A21 21 weeks gestation of pregnancy: Secondary | ICD-10-CM | POA: Diagnosis not present

## 2022-09-19 DIAGNOSIS — Z369 Encounter for antenatal screening, unspecified: Secondary | ICD-10-CM | POA: Diagnosis not present

## 2022-10-06 ENCOUNTER — Telehealth: Payer: Self-pay | Admitting: Obstetrics & Gynecology

## 2022-10-06 ENCOUNTER — Encounter: Payer: Medicaid Other | Admitting: Obstetrics & Gynecology

## 2022-10-06 NOTE — Telephone Encounter (Signed)
Reached out to pt to reschedule ROB appt that was scheduled on 10/06/2022 with Dr. Hulan Fray at 1:15.  Call could not be completed, could not leave a message.

## 2022-10-07 ENCOUNTER — Ambulatory Visit (INDEPENDENT_AMBULATORY_CARE_PROVIDER_SITE_OTHER): Payer: Medicaid Other | Admitting: Obstetrics and Gynecology

## 2022-10-07 ENCOUNTER — Encounter: Payer: Self-pay | Admitting: Obstetrics and Gynecology

## 2022-10-07 VITALS — BP 117/58 | HR 83 | Wt 208.3 lb

## 2022-10-07 DIAGNOSIS — O99210 Obesity complicating pregnancy, unspecified trimester: Secondary | ICD-10-CM

## 2022-10-07 DIAGNOSIS — Z113 Encounter for screening for infections with a predominantly sexual mode of transmission: Secondary | ICD-10-CM

## 2022-10-07 DIAGNOSIS — O0932 Supervision of pregnancy with insufficient antenatal care, second trimester: Secondary | ICD-10-CM

## 2022-10-07 DIAGNOSIS — O09899 Supervision of other high risk pregnancies, unspecified trimester: Secondary | ICD-10-CM

## 2022-10-07 DIAGNOSIS — Z130 Encounter for screening for diseases of the blood and blood-forming organs and certain disorders involving the immune mechanism: Secondary | ICD-10-CM

## 2022-10-07 DIAGNOSIS — Z362 Encounter for other antenatal screening follow-up: Secondary | ICD-10-CM

## 2022-10-07 DIAGNOSIS — Z131 Encounter for screening for diabetes mellitus: Secondary | ICD-10-CM

## 2022-10-07 DIAGNOSIS — R319 Hematuria, unspecified: Secondary | ICD-10-CM

## 2022-10-07 DIAGNOSIS — Z3A24 24 weeks gestation of pregnancy: Secondary | ICD-10-CM

## 2022-10-07 LAB — POCT URINALYSIS DIPSTICK OB
Bilirubin, UA: NEGATIVE
Glucose, UA: NEGATIVE
Leukocytes, UA: NEGATIVE
Nitrite, UA: NEGATIVE
Spec Grav, UA: >=1.03 — AB (ref 1.010–1.025)
Urobilinogen, UA: 0.2 E.U./dL
pH, UA: 6 (ref 5.0–8.0)

## 2022-10-07 NOTE — Telephone Encounter (Signed)
Pt was rescheduled for 10/07/2022 with Dr. Marcelline Mates at 2:55.

## 2022-10-07 NOTE — Progress Notes (Signed)
ROB: Patient is a 26 y.o. G2P1001 at 106w3dwho presents for routine care. [facility], but will do better to remember them now.  Patient had anatomy scan performed, overall normal but was incomplete (several heart views missing).  Will have patient scheduled for follow-up anatomy scan in the next several weeks.  Completed maternity 21 last visit, is due for interpregnancy to complete AFP.  RTC in 4 weeks, for 28-week labs at that visit.

## 2022-10-07 NOTE — Progress Notes (Signed)
ROB [redacted]w[redacted]d She is doing well. She has good fetal movement. No new concerns today.

## 2022-10-09 LAB — URINE CULTURE

## 2022-10-17 ENCOUNTER — Ambulatory Visit: Admission: RE | Admit: 2022-10-17 | Payer: Medicaid Other | Source: Ambulatory Visit

## 2022-10-29 ENCOUNTER — Ambulatory Visit
Admission: RE | Admit: 2022-10-29 | Discharge: 2022-10-29 | Disposition: A | Discharge status: Home / Self Care | Payer: Medicaid Other | Source: Ambulatory Visit | Attending: Obstetrics and Gynecology | Admitting: Obstetrics and Gynecology

## 2022-10-29 DIAGNOSIS — Z362 Encounter for other antenatal screening follow-up: Secondary | ICD-10-CM | POA: Diagnosis not present

## 2022-10-29 DIAGNOSIS — Z3689 Encounter for other specified antenatal screening: Secondary | ICD-10-CM | POA: Diagnosis not present

## 2022-10-29 DIAGNOSIS — Z3A28 28 weeks gestation of pregnancy: Secondary | ICD-10-CM | POA: Diagnosis not present

## 2022-11-04 ENCOUNTER — Ambulatory Visit (INDEPENDENT_AMBULATORY_CARE_PROVIDER_SITE_OTHER): Payer: Medicaid Other | Admitting: Obstetrics

## 2022-11-04 ENCOUNTER — Other Ambulatory Visit: Payer: Medicaid Other

## 2022-11-04 VITALS — BP 143/80 | HR 99 | Wt 213.7 lb

## 2022-11-04 DIAGNOSIS — Z130 Encounter for screening for diseases of the blood and blood-forming organs and certain disorders involving the immune mechanism: Secondary | ICD-10-CM | POA: Diagnosis not present

## 2022-11-04 DIAGNOSIS — Z23 Encounter for immunization: Secondary | ICD-10-CM | POA: Diagnosis not present

## 2022-11-04 DIAGNOSIS — O0993 Supervision of high risk pregnancy, unspecified, third trimester: Secondary | ICD-10-CM

## 2022-11-04 DIAGNOSIS — Z3A28 28 weeks gestation of pregnancy: Secondary | ICD-10-CM

## 2022-11-04 DIAGNOSIS — Z113 Encounter for screening for infections with a predominantly sexual mode of transmission: Secondary | ICD-10-CM

## 2022-11-04 DIAGNOSIS — Z3A24 24 weeks gestation of pregnancy: Secondary | ICD-10-CM

## 2022-11-04 DIAGNOSIS — O09899 Supervision of other high risk pregnancies, unspecified trimester: Secondary | ICD-10-CM

## 2022-11-04 DIAGNOSIS — Z131 Encounter for screening for diabetes mellitus: Secondary | ICD-10-CM

## 2022-11-04 LAB — POCT URINALYSIS DIPSTICK OB
Bilirubin, UA: NEGATIVE
Glucose, UA: NEGATIVE
Ketones, UA: NEGATIVE
Leukocytes, UA: NEGATIVE
Nitrite, UA: NEGATIVE
Spec Grav, UA: 1.025 (ref 1.010–1.025)
Urobilinogen, UA: 0.2 E.U./dL
pH, UA: 6 (ref 5.0–8.0)

## 2022-11-04 NOTE — Progress Notes (Signed)
Routine Prenatal Care Visit  Subjective  Dawn Richards Jerilee Hoh is a 27 y.o. G2P1001 at [redacted]w[redacted]d being seen today for ongoing prenatal care.  She is currently monitored for the following issues for this low-risk pregnancy and has Female dyspareunia; Dysmenorrhea; Abnormal uterine bleeding (AUB); Adjustment disorder with mixed disturbance of emotions and conduct; NSAID overdose, intentional self-harm, sequela ([place]); Supervision of high risk pregnancy, antepartum; Obesity in pregnancy; Genital herpes simplex type 2; Supervision of other high risk pregnancies, unspecified trimester; and Late prenatal care affecting pregnancy in second trimester on their problem list.  -------------------------------------------------She has changed jobs so that she is not working as much. Contractions: Not present. Vag. Bleeding: None.  Movement: Present. Leaking Fluid denies.  ----------------------------------------------------------------------------------- The following portions of the patient's history were reviewed and updated as appropriate: allergies, current medications, past family history, past medical history, past social history, past surgical history and problem list. Problem list updated.  Objective  Blood pressure (!) 143/80, pulse 99, weight 213 lb 11.2 oz (96.9 kg), last menstrual period 04/12/2022, unknown if currently breastfeeding. Pregravid weight 195 lb (88.5 kg) Total Weight Gain 18 lb 11.2 oz (8.482 kg) Urinalysis: Urine Protein    Urine Glucose    Fetal Status:     Movement: Present     General:  Alert, oriented and cooperative. Patient is in no acute distress.  Skin: Skin is warm and dry. No rash noted.   Cardiovascular: Normal heart rate noted  Respiratory: Normal respiratory effort, no problems with respiration noted  Abdomen: Soft, gravid, appropriate for gestational age. Pain/Pressure: Present     Pelvic:  Cervical exam deferred        Extremities: Normal range of motion.   Edema: None  Mental Status: Normal mood and affect. Normal behavior. Normal judgment and thought content.   Assessment   27 y.o. G2P1001 at [redacted]w[redacted]d by  01/24/2023, by Ultrasound presenting for routine prenatal visit  Plan   second Problems (from 06/18/22 to present)     Problem Noted Resolved   Supervision of other normal pregnancy, antepartum 06/18/2022 by Cleophas Dunker, [place] 09/08/2022 by Emily Filbert, MD   Overview Addendum 06/18/2022 10:43 AM by Cleophas Dunker, [place] Staff Provider  Office Location  [facility] Dating  Not found.  Language  English Anatomy US    Flu Vaccine  offer Genetic Screen  NIPS:   TDaP vaccine   offer Hgb A1C or  GTT Early : Third trimester :   Covid declined   LAB RESULTS   Rhogam     Blood Type     Feeding Plan breast Antibody    Contraception IUD Rubella    Circumcision YES RPR Non Reactive (10/04 1134)   Pediatrician  [facility] HBsAg Negative (10/04 1134)   Support Person Daquan HIV Non Reactive (10/04 1134)  Prenatal Classes no Varicella     GBS  (For PCN allergy, check sensitivities)   BTL Consent  Hep C     VBAC Consent  Pap Diagnosis  Date Value Ref Range Status  05/14/2022   Final   - Negative for intraepithelial lesion or malignancy (NILM)      Hgb Electro      CF      SMA                   Preterm labor symptoms and general obstetric precautions including but not limited to vaginal bleeding, contractions, leaking of fluid and fetal movement were  reviewed in detail with the patient. Please refer to After Visit Summary for other counseling  28 week labs today.   Return in about 2 weeks (around 11/18/2022) for return OB.  Imagene Riches, CNM  11/04/2022 9:20 AM

## 2022-11-05 LAB — 28 WEEK RH+PANEL
Basophils Absolute: 0 10*3/uL (ref 0.0–0.2)
Basos: 0 %
EOS (ABSOLUTE): 0.1 10*3/uL (ref 0.0–0.4)
Eos: 1 %
Gestational Diabetes Screen: 132 mg/dL (ref 70–139)
HIV Screen 4th Generation wRfx: NONREACTIVE
Hematocrit: 29.2 % — ABNORMAL LOW (ref 34.0–46.6)
Hemoglobin: 9.7 g/dL — ABNORMAL LOW (ref 11.1–15.9)
Immature Grans (Abs): 0.1 10*3/uL (ref 0.0–0.1)
Immature Granulocytes: 1 %
Lymphocytes Absolute: 1.5 10*3/uL (ref 0.7–3.1)
Lymphs: 16 %
MCH: 29.2 pg (ref 26.6–33.0)
MCHC: 33.2 g/dL (ref 31.5–35.7)
MCV: 88 fL (ref 79–97)
Monocytes Absolute: 0.5 10*3/uL (ref 0.1–0.9)
Monocytes: 5 %
Neutrophils Absolute: 7.5 10*3/uL — ABNORMAL HIGH (ref 1.4–7.0)
Neutrophils: 77 %
Platelets: 255 10*3/uL (ref 150–450)
RBC: 3.32 x10E6/uL — ABNORMAL LOW (ref 3.77–5.28)
RDW: 12.3 % (ref 11.7–15.4)
RPR Ser Ql: NONREACTIVE
WBC: 9.7 10*3/uL (ref 3.4–10.8)

## 2022-11-10 ENCOUNTER — Telehealth: Payer: Self-pay | Admitting: Licensed Practical Nurse

## 2022-11-10 NOTE — Telephone Encounter (Signed)
Reached out to pt to schedule ROB.  Scheduled the appt for 11/17/2022 with Alma Friendly at 8:35.  Left message for pt to call back to confirm.

## 2022-11-13 NOTE — Telephone Encounter (Signed)
The patient called confirmed

## 2022-11-17 ENCOUNTER — Ambulatory Visit (INDEPENDENT_AMBULATORY_CARE_PROVIDER_SITE_OTHER): Payer: Medicaid Other | Admitting: Licensed Practical Nurse

## 2022-11-17 ENCOUNTER — Encounter: Payer: Self-pay | Admitting: Licensed Practical Nurse

## 2022-11-17 VITALS — BP 115/65 | HR 88 | Wt 217.0 lb

## 2022-11-17 DIAGNOSIS — D649 Anemia, unspecified: Secondary | ICD-10-CM

## 2022-11-17 DIAGNOSIS — O99013 Anemia complicating pregnancy, third trimester: Secondary | ICD-10-CM

## 2022-11-17 DIAGNOSIS — O0993 Supervision of high risk pregnancy, unspecified, third trimester: Secondary | ICD-10-CM | POA: Diagnosis not present

## 2022-11-17 DIAGNOSIS — Z3A30 30 weeks gestation of pregnancy: Secondary | ICD-10-CM | POA: Diagnosis not present

## 2022-11-17 DIAGNOSIS — O26843 Uterine size-date discrepancy, third trimester: Secondary | ICD-10-CM

## 2022-11-17 LAB — POCT URINALYSIS DIPSTICK
Bilirubin, UA: NEGATIVE
Glucose, UA: NEGATIVE
Ketones, UA: NEGATIVE
Leukocytes, UA: NEGATIVE
Nitrite, UA: NEGATIVE
Protein, UA: NEGATIVE
Spec Grav, UA: 1.015 (ref 1.010–1.025)
Urobilinogen, UA: 1 E.U./dL
pH, UA: 7 (ref 5.0–8.0)

## 2022-11-17 NOTE — Progress Notes (Signed)
Routine Prenatal Care Visit  Subjective  Dawn Richards [name] is a 27 y.o. G2P1001 at [redacted]w[redacted]d being seen today for ongoing prenatal care.  She is currently monitored for the following issues for this high-risk pregnancy and has Female dyspareunia; Dysmenorrhea; Abnormal uterine bleeding (AUB); Adjustment disorder with mixed disturbance of emotions and conduct; NSAID overdose, intentional self-harm, sequela; Supervision of high risk pregnancy, antepartum; Obesity in pregnancy; Genital herpes simplex type 2; Supervision of other high risk pregnancies, unspecified trimester; and Late prenatal care affecting pregnancy in second trimester on their problem list.  ----------------------------------------------------------------------------------- Patient reports fatigue, no bleeding, no contractions, no cramping, and no leaking.   Contractions: Not present. Vag. Bleeding: None.  Movement: Present. Leaking Fluid denies.  ----------------------------------------------------------------------------------- The following portions of the patient's history were reviewed and updated as appropriate: allergies, current medications, past family history, past medical history, past social history, past surgical history and problem list. Problem list updated.  Objective  Blood pressure 115/65, pulse 88, weight 217 lb (98.4 kg), last menstrual period 04/12/2022, unknown if currently breastfeeding. Pregravid weight 195 lb (88.5 kg) Total Weight Gain 22 lb (9.979 kg) Urinalysis: Urine Protein    Urine Glucose    Fetal Status: Fetal Heart Rate (bpm): 150 Fundal Height: 34 cm Movement: Present     General:  Alert, oriented and cooperative. Patient is in no acute distress.  Skin: Skin is warm and dry. No rash noted.   Cardiovascular: Normal heart rate noted  Respiratory: Normal respiratory effort, no problems with respiration noted  Abdomen: Soft, gravid, appropriate for gestational age. Pain/Pressure: Absent      Pelvic:  Cervical exam deferred        Extremities: Normal range of motion.  Edema: Trace  Mental Status: Normal mood and affect. Normal behavior. Normal judgment and thought content.   Assessment   27 y.o. G2P1001 at [redacted]w[redacted]d by  01/24/2023, by Ultrasound presenting for routine prenatal visit  Plan   second Problems (from 06/18/22 to present)     Problem Noted Resolved   Supervision of other normal pregnancy, antepartum 06/18/2022 by Loran Senters, CMA 09/08/2022 by Allie Bossier, MD   Overview Addendum 06/18/2022 10:43 AM by Loran Senters, CMA     Clinical Staff Provider  Office Location   [facility] Dating  Not found.  Language  English Anatomy US    Flu Vaccine  offer Genetic Screen  NIPS:   TDaP vaccine   offer Hgb A1C or  GTT Early : Third trimester :   Covid declined   LAB RESULTS   Rhogam     Blood Type     Feeding Plan breast Antibody    Contraception IUD Rubella    Circumcision YES RPR Non Reactive (10/04 1134)   Pediatrician  [facility] HBsAg Negative (10/04 1134)   Support Person Daquan HIV Non Reactive (10/04 1134)  Prenatal Classes no Varicella     GBS  (For PCN allergy, check sensitivities)   BTL Consent  Hep C     VBAC Consent  Pap Diagnosis  Date Value Ref Range Status  05/14/2022   Final   - Negative for intraepithelial lesion or malignancy (NILM)      Hgb Electro      CF      SMA                   Preterm labor symptoms and general obstetric precautions including but not limited to vaginal bleeding, contractions, leaking of fluid and fetal  movement were reviewed in detail with the patient. Discussed increased BMI today with diet and exercise recommendations; patient amenable to recommendations. Patient stated she has been emotional lately with her 19 year old daughter's behavior as she has been acting out as her pregnancy has progressed. Discussed that that is normal at this stage and that the 27 y.o. might regress behaviorally when the baby  arrives. Growth scan ordered today for fundal height inconsistent with gestational age. Discussed the possibly of repeat growth scans and NSTs due to high BMI as well as a possible scheduled induction.  Unaware that she needs to be taking iron; will pick up iron tablets today.  Patient seen by SNM, Earlie Counts and Carie Caddy, CNM Please refer to After Visit Summary for other counseling recommendations.   Return in about 2 weeks (around 12/01/2022) for ROB.  Carie Caddy, CNM  [facility]  11/17/22  9:17 AM

## 2022-11-19 LAB — URINE CULTURE

## 2022-11-24 ENCOUNTER — Other Ambulatory Visit: Payer: Medicaid Other

## 2022-12-04 ENCOUNTER — Encounter: Payer: Self-pay | Admitting: Obstetrics and Gynecology

## 2022-12-04 ENCOUNTER — Ambulatory Visit (INDEPENDENT_AMBULATORY_CARE_PROVIDER_SITE_OTHER): Payer: Medicaid Other

## 2022-12-04 ENCOUNTER — Ambulatory Visit (INDEPENDENT_AMBULATORY_CARE_PROVIDER_SITE_OTHER): Payer: Medicaid Other | Admitting: Obstetrics and Gynecology

## 2022-12-04 VITALS — BP 125/78 | HR 101 | Wt 211.0 lb

## 2022-12-04 DIAGNOSIS — O0993 Supervision of high risk pregnancy, unspecified, third trimester: Secondary | ICD-10-CM

## 2022-12-04 DIAGNOSIS — Z3A32 32 weeks gestation of pregnancy: Secondary | ICD-10-CM

## 2022-12-04 DIAGNOSIS — O26843 Uterine size-date discrepancy, third trimester: Secondary | ICD-10-CM

## 2022-12-04 LAB — POCT URINALYSIS DIPSTICK OB
Bilirubin, UA: NEGATIVE
Clarity, UA: NEGATIVE
Glucose, UA: NEGATIVE
Ketones, UA: NEGATIVE
Leukocytes, UA: NEGATIVE
Nitrite, UA: NEGATIVE
POC,PROTEIN,UA: NEGATIVE
Spec Grav, UA: 1.025 (ref 1.010–1.025)
Urobilinogen, UA: 0.2 E.U./dL
pH, UA: 6.5 (ref 5.0–8.0)

## 2022-12-04 NOTE — Progress Notes (Signed)
ROB: No complaints.  Having occasional Deberah Pelton.  Ultrasound today for size greater than dates.

## 2022-12-04 NOTE — Progress Notes (Signed)
ROB. She states daily fetal movement. She is currently getting over a recent stomach bug. Growth ultrasound rescheduled for today.

## 2022-12-04 NOTE — Addendum Note (Signed)
Addended by: Georgiana Shore R on: 12/04/2022 12:03 PM   Modules accepted: Orders

## 2022-12-11 LAB — URINE CULTURE

## 2022-12-18 ENCOUNTER — Encounter: Payer: Medicaid Other | Admitting: Advanced Practice Midwife

## 2022-12-21 ENCOUNTER — Encounter: Payer: Self-pay | Admitting: Obstetrics and Gynecology

## 2022-12-21 ENCOUNTER — Other Ambulatory Visit: Payer: Self-pay

## 2022-12-21 ENCOUNTER — Observation Stay
Admission: EM | Admit: 2022-12-21 | Discharge: 2022-12-21 | Disposition: A | Discharge status: Home / Self Care | Payer: Medicaid Other | Attending: Certified Nurse Midwife | Admitting: Certified Nurse Midwife

## 2022-12-21 DIAGNOSIS — Z3A35 35 weeks gestation of pregnancy: Secondary | ICD-10-CM | POA: Insufficient documentation

## 2022-12-21 DIAGNOSIS — R112 Nausea with vomiting, unspecified: Secondary | ICD-10-CM | POA: Diagnosis not present

## 2022-12-21 DIAGNOSIS — O26893 Other specified pregnancy related conditions, third trimester: Principal | ICD-10-CM | POA: Insufficient documentation

## 2022-12-21 DIAGNOSIS — O219 Vomiting of pregnancy, unspecified: Secondary | ICD-10-CM

## 2022-12-21 DIAGNOSIS — R197 Diarrhea, unspecified: Secondary | ICD-10-CM | POA: Diagnosis not present

## 2022-12-21 DIAGNOSIS — O99013 Anemia complicating pregnancy, third trimester: Secondary | ICD-10-CM

## 2022-12-21 LAB — COMPREHENSIVE METABOLIC PANEL
ALT: 8 U/L (ref 0–44)
AST: 14 U/L — ABNORMAL LOW (ref 15–41)
Albumin: 2.5 g/dL — ABNORMAL LOW (ref 3.5–5.0)
Alkaline Phosphatase: 102 U/L (ref 38–126)
Anion gap: 9 (ref 5–15)
BUN: 6 mg/dL (ref 6–20)
CO2: 18 mmol/L — ABNORMAL LOW (ref 22–32)
Calcium: 8.1 mg/dL — ABNORMAL LOW (ref 8.9–10.3)
Chloride: 107 mmol/L (ref 98–111)
Creatinine, Ser: 0.37 mg/dL — ABNORMAL LOW (ref 0.44–1.00)
GFR, Estimated: >60 mL/min (ref >60)
Glucose, Bld: 116 mg/dL — ABNORMAL HIGH (ref 70–99)
Potassium: 3.2 mmol/L — ABNORMAL LOW (ref 3.5–5.1)
Sodium: 134 mmol/L — ABNORMAL LOW (ref 135–145)
Total Bilirubin: 0.5 mg/dL (ref 0.3–1.2)
Total Protein: 6.2 g/dL — ABNORMAL LOW (ref 6.5–8.1)

## 2022-12-21 LAB — URINALYSIS, MICROSCOPIC (REFLEX): Bacteria, UA: NONE SEEN

## 2022-12-21 LAB — URINALYSIS, ROUTINE W REFLEX MICROSCOPIC
Bilirubin Urine: NEGATIVE
Glucose, UA: NEGATIVE mg/dL
Ketones, ur: 15 mg/dL — AB
Leukocytes,Ua: NEGATIVE
Nitrite: NEGATIVE
Protein, ur: 30 mg/dL — AB
Specific Gravity, Urine: >1.03 — ABNORMAL HIGH (ref 1.005–1.030)
pH: 5.5 (ref 5.0–8.0)

## 2022-12-21 LAB — CBC
HCT: 31.8 % — ABNORMAL LOW (ref 36.0–46.0)
Hemoglobin: 10.3 g/dL — ABNORMAL LOW (ref 12.0–15.0)
MCH: 28.4 pg (ref 26.0–34.0)
MCHC: 32.4 g/dL (ref 30.0–36.0)
MCV: 87.6 fL (ref 80.0–100.0)
Platelets: 225 10*3/uL (ref 150–400)
RBC: 3.63 MIL/uL — ABNORMAL LOW (ref 3.87–5.11)
RDW: 13.1 % (ref 11.5–15.5)
WBC: 7.1 10*3/uL (ref 4.0–10.5)
nRBC: 0 % (ref 0.0–0.2)

## 2022-12-21 MED ORDER — ACETAMINOPHEN 325 MG PO TABS
650.0000 mg | ORAL_TABLET | ORAL | Status: DC | PRN
  Administered 2022-12-21: 650 mg via ORAL
  Filled 2022-12-21: qty 2

## 2022-12-21 MED ORDER — LACTATED RINGERS IV SOLN
500.0000 mL | INTRAVENOUS | Status: DC | PRN
  Administered 2022-12-21: 500 mL via INTRAVENOUS
  Administered 2022-12-21: 1000 mL via INTRAVENOUS

## 2022-12-21 MED ORDER — ONDANSETRON HCL 4 MG/2ML IJ SOLN
4.0000 mg | Freq: Four times a day (QID) | INTRAMUSCULAR | Status: DC | PRN
  Administered 2022-12-21: 4 mg via INTRAVENOUS
  Filled 2022-12-21: qty 2

## 2022-12-21 MED ORDER — ONDANSETRON 4 MG PO TBDP: 4.0000 mg | ORAL_TABLET | Freq: Three times a day (TID) | ORAL | 0 refills | Status: DC | PRN

## 2022-12-21 MED ORDER — POTASSIUM CHLORIDE 10 MEQ/100ML IV SOLN
10.0000 meq | INTRAVENOUS | Status: AC
  Administered 2022-12-21 (×2): 10 meq via INTRAVENOUS
  Filled 2022-12-21 (×2): qty 100

## 2022-12-21 MED ORDER — LACTATED RINGERS IV SOLN: INTRAVENOUS | Status: DC

## 2022-12-21 MED ORDER — SODIUM CHLORIDE 0.9 % IV SOLN
25.0000 mg | Freq: Four times a day (QID) | INTRAVENOUS | Status: DC | PRN
  Administered 2022-12-21: 25 mg via INTRAVENOUS
  Filled 2022-12-21: qty 1
  Filled 2022-12-21: qty 25

## 2022-12-21 NOTE — OB Triage Note (Signed)

## 2022-12-21 NOTE — OB Triage Note (Signed)
   L&D OB Triage Note  SUBJECTIVE Dawn Richards Dawn Richards is a 27 y.o. G2P1001 female at [redacted]w[redacted]d, EDD Estimated Date of Delivery: 01/24/23 who presented to triage with complaints of N&V and diarrhea since last night. She denies contractions, leaking of fluid, vaginal bleeding and is feeling good fetal movement.   OB History  Gravida Para Term Preterm AB Living  2 1 1  0 0 1  SAB IAB Ectopic Multiple Live Births  0 0 0 0 1    # Outcome Date GA Lbr Len/2nd Weight Sex Delivery Anes PTL Lv  2 Current           1 Term 01/31/20 [redacted]w[redacted]d / 00:30 3270 g F Vag-Spont None  LIV     Name: Dawn Richards Dawn Richards     Apgar1: 7  Apgar5: 9    Medications Prior to Admission  Medication Sig Dispense Refill Last Dose   acyclovir (ZOVIRAX) 800 MG tablet Take 1 tablet (800 mg total) by mouth 5 (five) times daily. 30 tablet 11 12/20/2022   Prenatal Vit-Fe Fumarate-FA (PRENATAL VITAMINS) 28-0.8 MG TABS Take 1 tablet by mouth daily. 30 tablet 11 12/20/2022     OBJECTIVE  Nursing Evaluation:   BP 124/72 (BP Location: Right Arm)   Pulse 96   Temp 98.8 F (37.1 C) (Oral)   Resp 16   LMP 04/12/2022 (Exact Date)    Findings:   dehydration, gastroenteritis in pregnancy            NST was performed and has been reviewed by me.  NST INTERPRETATION: Category I  Mode: External Fetal heart rate 155  Variability: Moderate Accelerations: 15 x 15 Decelerations: None     Contraction Frequency (min): none  ASSESSMENT Impression:  1.  Pregnancy:  G2P1001 at [redacted]w[redacted]d , EDD Estimated Date of Delivery: 01/24/23 2.  Reassuring fetal and maternal status 3.  IV fluid hydration, potassium replacement, antiemetics resolved symptoms.   PLAN 1. Current condition and above findings reviewed.  Reassuring fetal and maternal condition. PT now tolerating po and crackers , requesting to go home.  2. Discharge home with standard labor precautions given to return to L&D or call the office for problems. 3.  Continue routine prenatal care.Follow up as scheduled in the office.   Doreene Burke, CNM

## 2022-12-22 ENCOUNTER — Other Ambulatory Visit (HOSPITAL_COMMUNITY)
Admission: RE | Admit: 2022-12-22 | Discharge: 2022-12-22 | Disposition: A | Discharge status: Home / Self Care | Payer: Medicaid Other | Source: Ambulatory Visit | Attending: Obstetrics | Admitting: Obstetrics

## 2022-12-22 ENCOUNTER — Ambulatory Visit (INDEPENDENT_AMBULATORY_CARE_PROVIDER_SITE_OTHER): Payer: Medicaid Other | Admitting: Obstetrics

## 2022-12-22 VITALS — BP 111/66 | HR 81 | Wt 214.0 lb

## 2022-12-22 DIAGNOSIS — Z113 Encounter for screening for infections with a predominantly sexual mode of transmission: Secondary | ICD-10-CM

## 2022-12-22 DIAGNOSIS — Z3483 Encounter for supervision of other normal pregnancy, third trimester: Secondary | ICD-10-CM | POA: Diagnosis not present

## 2022-12-22 DIAGNOSIS — Z3A35 35 weeks gestation of pregnancy: Secondary | ICD-10-CM

## 2022-12-22 LAB — POCT URINALYSIS DIPSTICK OB
Bilirubin, UA: NEGATIVE
Blood, UA: NEGATIVE
Glucose, UA: NEGATIVE
Ketones, UA: NEGATIVE
Leukocytes, UA: NEGATIVE
Nitrite, UA: NEGATIVE
POC,PROTEIN,UA: NEGATIVE
Spec Grav, UA: 1.01 (ref 1.010–1.025)
Urobilinogen, UA: 0.2 E.U./dL
pH, UA: 7.5 (ref 5.0–8.0)

## 2022-12-22 MED ORDER — VALACYCLOVIR HCL 500 MG PO TABS: 500.0000 mg | ORAL_TABLET | Freq: Two times a day (BID) | ORAL | 6 refills | Status: DC

## 2022-12-22 NOTE — Progress Notes (Signed)
ROB at [redacted]w[redacted]d. Denies ctx, LOF, and vaginal bleeding. Dawn Richards was in the ED yesterday for vomiting. She feels much better after receiving IV fluids, potassium, and antiemetics. She has been able to keep down food today. Reviewed plan for labor (desires unmedicated birth), s/s of labor and when to go to the hospital.  Discussed valacyclovir for HSV suppression. Rx sent to [facility] in [place]. GBS and GC/chlamydia swabs collected today. RTC in 1-2 weeks.  Glenetta Borg, CNM

## 2022-12-22 NOTE — Addendum Note (Signed)
Addended by: Loney Laurence on: 12/22/2022 11:37 AM   Modules accepted: Orders

## 2022-12-23 LAB — CERVICOVAGINAL ANCILLARY ONLY
Chlamydia: NEGATIVE
Comment: NEGATIVE
Comment: NORMAL
Neisseria Gonorrhea: NEGATIVE

## 2022-12-26 LAB — CULTURE, BETA STREP (GROUP B ONLY): Strep Gp B Culture: NEGATIVE

## 2023-01-06 ENCOUNTER — Ambulatory Visit (INDEPENDENT_AMBULATORY_CARE_PROVIDER_SITE_OTHER): Payer: Medicaid Other | Admitting: Obstetrics

## 2023-01-06 ENCOUNTER — Other Ambulatory Visit (HOSPITAL_COMMUNITY)
Admission: RE | Admit: 2023-01-06 | Discharge: 2023-01-06 | Disposition: A | Discharge status: Home / Self Care | Payer: Medicaid Other | Source: Ambulatory Visit | Attending: Obstetrics | Admitting: Obstetrics

## 2023-01-06 ENCOUNTER — Encounter: Payer: Self-pay | Admitting: Obstetrics

## 2023-01-06 VITALS — Wt 213.0 lb

## 2023-01-06 DIAGNOSIS — O0990 Supervision of high risk pregnancy, unspecified, unspecified trimester: Secondary | ICD-10-CM

## 2023-01-06 DIAGNOSIS — Z113 Encounter for screening for infections with a predominantly sexual mode of transmission: Secondary | ICD-10-CM | POA: Diagnosis present

## 2023-01-06 DIAGNOSIS — Z3483 Encounter for supervision of other normal pregnancy, third trimester: Secondary | ICD-10-CM

## 2023-01-06 DIAGNOSIS — Z3A37 37 weeks gestation of pregnancy: Secondary | ICD-10-CM

## 2023-01-06 LAB — POCT URINALYSIS DIPSTICK OB
Bilirubin, UA: NEGATIVE
Glucose, UA: NEGATIVE
Leukocytes, UA: NEGATIVE
Nitrite, UA: NEGATIVE
Spec Grav, UA: >=1.03 — AB (ref 1.010–1.025)
Urobilinogen, UA: 0.2 E.U./dL
pH, UA: 5 (ref 5.0–8.0)

## 2023-01-06 NOTE — Progress Notes (Signed)
Routine Prenatal Care Visit  Subjective  Dawn Richards is a 27 y.o. G2P1001 at [redacted]w[redacted]d being seen today for ongoing prenatal care.  She is currently monitored for the following issues for this high-risk pregnancy and has Female dyspareunia; Dysmenorrhea; Abnormal uterine bleeding (AUB); Adjustment disorder with mixed disturbance of emotions and conduct; NSAID overdose, intentional self-harm, sequela (HCC); Supervision of high risk pregnancy, antepartum; Obesity in pregnancy; Genital herpes simplex type 2; Supervision of other high risk pregnancies, unspecified trimester; Late prenatal care affecting pregnancy in second trimester; and Anemia affecting pregnancy in third trimester on their problem list.  ----------------------------------------------------------------------------------- Patient reports no complaints.   Contractions: Not present. Vag. Bleeding: None.  Movement: Present. Leaking Fluid denies.  ----------------------------------------------------------------------------------- The following portions of the patient's history were reviewed and updated as appropriate: allergies, current medications, past family history, past medical history, past social history, past surgical history and problem list. Problem list updated.  Objective  Weight 213 lb (96.6 kg), last menstrual period 04/12/2022, unknown if currently breastfeeding. Pregravid weight 195 lb (88.5 kg) Total Weight Gain 18 lb (8.165 kg) Urinalysis: Urine Protein    Urine Glucose    Fetal Status: Fetal Heart Rate (bpm): 139 Fundal Height: 36 cm Movement: Present  Presentation: Vertex  General:  Alert, oriented and cooperative. Patient is in no acute distress.  Skin: Skin is warm and dry. No rash noted.   Cardiovascular: Normal heart rate noted  Respiratory: Normal respiratory effort, no problems with respiration noted  Abdomen: Soft, gravid, appropriate for gestational age. Pain/Pressure: Present     Pelvic:   Cervical exam deferred        Extremities: Normal range of motion.  Edema: None  Mental Status: Normal mood and affect. Normal behavior. Normal judgment and thought content.   Assessment   27 y.o. G2P1001 at [redacted]w[redacted]d by  01/24/2023, by Ultrasound presenting for routine prenatal visit  Plan   second Problems (from 06/18/22 to present)     Problem Noted Resolved   Anemia affecting pregnancy in third trimester 11/17/2022 by Ellwood Sayers, CNM No   Supervision of other normal pregnancy, antepartum 06/18/2022 by Loran Senters, CMA 09/08/2022 by Allie Bossier, MD   Overview Addendum 06/18/2022 10:43 AM by Loran Senters, CMA     Clinical Staff Provider  Office Location  [facility] Dating  Not found.  Language  English Anatomy US    Flu Vaccine  offer Genetic Screen  NIPS:   TDaP vaccine   offer Hgb A1C or  GTT Early : Third trimester :   Covid declined   LAB RESULTS   Rhogam     Blood Type     Feeding Plan breast Antibody    Contraception IUD Rubella    Circumcision YES RPR Non Reactive (10/04 1134)   Pediatrician  [facility] HBsAg Negative (10/04 1134)   Support Person Daquan HIV Non Reactive (10/04 1134)  Prenatal Classes no Varicella     GBS  (For PCN allergy, check sensitivities)   BTL Consent  Hep C     VBAC Consent  Pap Diagnosis  Date Value Ref Range Status  05/14/2022   Final   - Negative for intraepithelial lesion or malignancy (NILM)      Hgb Electro      CF      SMA                   Preterm labor symptoms and general obstetric precautions including but not limited  to vaginal bleeding, contractions, leaking of fluid and fetal movement were reviewed in detail with the patient. Please refer to After Visit Summary for other counseling recommendations.  GBS, GC retrieved today.  Return in about 1 week (around 01/13/2023) for return OB.  Mirna Mires, CNM  01/06/2023 9:57 AM

## 2023-01-06 NOTE — Addendum Note (Signed)
Addended by: Fonda Kinder on: 01/06/2023 10:28 AM   Modules accepted: Orders

## 2023-01-06 NOTE — Addendum Note (Signed)
Addended by: Fonda Kinder on: 01/06/2023 10:29 AM   Modules accepted: Orders

## 2023-01-06 NOTE — Addendum Note (Signed)
Addended by: Fonda Kinder on: 01/06/2023 10:27 AM   Modules accepted: Orders

## 2023-01-07 ENCOUNTER — Other Ambulatory Visit: Payer: Self-pay | Admitting: Obstetrics

## 2023-01-07 ENCOUNTER — Encounter: Payer: Self-pay | Admitting: Obstetrics

## 2023-01-07 DIAGNOSIS — B9689 Other specified bacterial agents as the cause of diseases classified elsewhere: Secondary | ICD-10-CM

## 2023-01-07 DIAGNOSIS — Z3483 Encounter for supervision of other normal pregnancy, third trimester: Secondary | ICD-10-CM

## 2023-01-07 LAB — CERVICOVAGINAL ANCILLARY ONLY
Bacterial Vaginitis (gardnerella): POSITIVE — AB
Candida Glabrata: NEGATIVE
Candida Vaginitis: POSITIVE — AB
Chlamydia: NEGATIVE
Comment: NEGATIVE
Comment: NEGATIVE
Comment: NEGATIVE
Comment: NEGATIVE
Comment: NEGATIVE
Comment: NORMAL
Neisseria Gonorrhea: NEGATIVE
Trichomonas: NEGATIVE

## 2023-01-07 MED ORDER — METRONIDAZOLE 500 MG PO TABS
500.0000 mg | ORAL_TABLET | Freq: Two times a day (BID) | ORAL | 0 refills | Status: AC
Start: 2023-01-07 — End: 2023-01-14

## 2023-01-07 NOTE — Progress Notes (Signed)
Aptima swab shows both yeast and BV. Rx for Metronidazole sent to her pharmacy. Bryden is notified via MyChart.  Mirna Mires, CNM  01/07/2023 1:41 PM

## 2023-01-13 ENCOUNTER — Ambulatory Visit (INDEPENDENT_AMBULATORY_CARE_PROVIDER_SITE_OTHER): Payer: Medicaid Other

## 2023-01-13 VITALS — BP 129/77 | HR 104 | Wt 222.7 lb

## 2023-01-13 DIAGNOSIS — E669 Obesity, unspecified: Secondary | ICD-10-CM

## 2023-01-13 DIAGNOSIS — Z3483 Encounter for supervision of other normal pregnancy, third trimester: Secondary | ICD-10-CM | POA: Diagnosis not present

## 2023-01-13 DIAGNOSIS — Z3A38 38 weeks gestation of pregnancy: Secondary | ICD-10-CM | POA: Diagnosis not present

## 2023-01-13 DIAGNOSIS — O23593 Infection of other part of genital tract in pregnancy, third trimester: Secondary | ICD-10-CM

## 2023-01-13 DIAGNOSIS — O98313 Other infections with a predominantly sexual mode of transmission complicating pregnancy, third trimester: Secondary | ICD-10-CM

## 2023-01-13 DIAGNOSIS — O99213 Obesity complicating pregnancy, third trimester: Secondary | ICD-10-CM

## 2023-01-13 DIAGNOSIS — A6009 Herpesviral infection of other urogenital tract: Secondary | ICD-10-CM

## 2023-01-13 DIAGNOSIS — O99210 Obesity complicating pregnancy, unspecified trimester: Secondary | ICD-10-CM

## 2023-01-13 DIAGNOSIS — O09899 Supervision of other high risk pregnancies, unspecified trimester: Secondary | ICD-10-CM

## 2023-01-13 DIAGNOSIS — A6000 Herpesviral infection of urogenital system, unspecified: Secondary | ICD-10-CM

## 2023-01-13 DIAGNOSIS — B9689 Other specified bacterial agents as the cause of diseases classified elsewhere: Secondary | ICD-10-CM

## 2023-01-13 LAB — POCT URINALYSIS DIPSTICK
Bilirubin, UA: NEGATIVE
Blood, UA: POSITIVE
Glucose, UA: NEGATIVE
Ketones, UA: NEGATIVE
Leukocytes, UA: NEGATIVE
Nitrite, UA: NEGATIVE
Protein, UA: NEGATIVE
Spec Grav, UA: 1.02 (ref 1.010–1.025)
Urobilinogen, UA: 0.2 E.U./dL
pH, UA: 7 (ref 5.0–8.0)

## 2023-01-13 NOTE — Assessment & Plan Note (Signed)
Taking daily suppression. Reviewed plan for SSE on admission to hospital to confirm absence of lesions prior to vaginal delivery.

## 2023-01-13 NOTE — Assessment & Plan Note (Signed)
Finishing prescribed antibiotics. Having some ongoing yeast symptoms. Discussed repeating Monistat with ongoing symptoms.

## 2023-01-13 NOTE — Progress Notes (Signed)
   Provider: @[facility] @   Return Prenatal Note   Assessment/Plan   Plan  27 y.o. G2P1001 at [redacted]w[redacted]d presents for follow-up OB visit. Reviewed prenatal record including previous visit note.  Genital herpes simplex type 2 Taking daily suppression. Reviewed plan for SSE on admission to hospital to confirm absence of lesions prior to vaginal delivery.   BV (bacterial vaginosis) Finishing prescribed antibiotics. Having some ongoing yeast symptoms. Discussed repeating Monistat with ongoing symptoms.   Obesity in pregnancy BMI now >40, recommended delivery by due date. IOL scheduled for 6/15 at 0800.  Supervision of other high risk pregnancies, unspecified trimester Cervical exam today 1/30/-3. Reviewed anticipatory guidance for what to expect with induction. Reviewed labor warning signs and expectations for birth. Instructed to call office or come to hospital with persistent headache, vision changes, regular contractions, leaking of fluid, decreased fetal movement or vaginal bleeding.    Orders Placed This Encounter  Procedures   Urine Culture    Order Specific Question:   Release to patient    Answer:   Immediate [1]   POCT Urinalysis Dipstick   Return in about 1 week (around 01/20/2023) for ROB.   Future Appointments  Date Time Provider Department Center  01/19/2023  3:35 PM Laurian Edrington, Lindalou Hose, CNM [facility] None    For next visit: ROB     Subjective   27 y.o. G2P1001 at [redacted]w[redacted]d presents for this follow-up prenatal visit.  Patient has no concerns. Questions about HSV and what impact it could have on delivery mode. Patient reports: Movement: Present Contractions: Not present  Objective   Flow sheet Vitals: Pulse Rate: (!) 104 BP: 129/77 Fundal Height: 38 cm Fetal Heart Rate (bpm): 155 Presentation: Vertex Dilation: 1 Effacement (%): 30 Station: -3 Total weight gain: 27 lb 11.2 oz (12.6 kg)  General Appearance  No acute distress, well appearing, and well  nourished Pulmonary   Normal work of breathing Neurologic   Alert and oriented to person, place, and time Psychiatric   Mood and affect within normal limits

## 2023-01-13 NOTE — Assessment & Plan Note (Signed)
BMI now >40, recommended delivery by due date. IOL scheduled for 6/15 at 0800.

## 2023-01-13 NOTE — Assessment & Plan Note (Addendum)
Cervical exam today 1/30/-3. Reviewed anticipatory guidance for what to expect with induction. Reviewed labor warning signs and expectations for birth. Instructed to call office or come to hospital with persistent headache, vision changes, regular contractions, leaking of fluid, decreased fetal movement or vaginal bleeding.

## 2023-01-15 LAB — URINE CULTURE

## 2023-01-17 ENCOUNTER — Observation Stay
Admission: EM | Admit: 2023-01-17 | Discharge: 2023-01-17 | Disposition: A | Discharge status: Home / Self Care | Payer: Medicaid Other | Attending: Certified Nurse Midwife | Admitting: Certified Nurse Midwife

## 2023-01-17 ENCOUNTER — Encounter: Payer: Self-pay | Admitting: Obstetrics and Gynecology

## 2023-01-17 ENCOUNTER — Other Ambulatory Visit: Payer: Self-pay

## 2023-01-17 DIAGNOSIS — O99013 Anemia complicating pregnancy, third trimester: Secondary | ICD-10-CM

## 2023-01-17 DIAGNOSIS — Z3A39 39 weeks gestation of pregnancy: Secondary | ICD-10-CM | POA: Insufficient documentation

## 2023-01-17 DIAGNOSIS — O471 False labor at or after 37 completed weeks of gestation: Secondary | ICD-10-CM | POA: Diagnosis present

## 2023-01-17 MED ORDER — ACETAMINOPHEN 325 MG PO TABS: 650.0000 mg | ORAL_TABLET | ORAL | Status: DC | PRN

## 2023-01-17 NOTE — OB Triage Note (Signed)
      L&D OB Triage Note  SUBJECTIVE Dawn Richards Dawn Richards is a 27 y.o. G2P1001 female at [redacted]w[redacted]d, EDD Estimated Date of Delivery: 01/24/23 who presented to triage with complaints of contraction.  She denies lof , vaginal bleeding, and is feeling good movement.   OB History  Gravida Para Term Preterm AB Living  2 1 1  0 0 1  SAB IAB Ectopic Multiple Live Births  0 0 0 0 1    # Outcome Date GA Lbr Len/2nd Weight Sex Delivery Anes PTL Lv  2 Current           1 Term 01/31/20 [redacted]w[redacted]d / 00:30 3270 g F Vag-Spont None  LIV     Name: Dawn Richards     Apgar1: 7  Apgar5: 9    Medications Prior to Admission  Medication Sig Dispense Refill Last Dose   acyclovir (ZOVIRAX) 800 MG tablet Take 1 tablet (800 mg total) by mouth 5 (five) times daily. 30 tablet 11 01/17/2023   Prenatal Vit-Fe Fumarate-FA (PRENATAL VITAMINS) 28-0.8 MG TABS Take 1 tablet by mouth daily. 30 tablet 11 01/17/2023   valACYclovir (VALTREX) 500 MG tablet Take 1 tablet (500 mg total) by mouth 2 (two) times daily. 30 tablet 6 01/17/2023   ondansetron (ZOFRAN-ODT) 4 MG disintegrating tablet Take 1 tablet (4 mg total) by mouth every 8 (eight) hours as needed for nausea or vomiting. (Patient not taking: Reported on 01/17/2023) 20 tablet 0 Completed Course     OBJECTIVE  Nursing Evaluation:   BP 120/64 (BP Location: Left Arm)   Pulse 81   Temp 98.2 F (36.8 C) (Oral)   Resp 18   Ht 5' (1.524 m)   Wt 100.7 kg   LMP 04/12/2022 (Exact Date)   BMI 43.36 kg/m    Findings:   contractions , no labor      NST was performed and has been reviewed by me.  NST INTERPRETATION: Category I  Mode: External Baseline Rate (A): 140 bpm (fht) Variability: Moderate Accelerations: 15 x 15 Declarations: absent      Contraction Frequency (min): UTD  ASSESSMENT Impression:  1.  Pregnancy:  G2P1001 at [redacted]w[redacted]d , EDD Estimated Date of Delivery: 01/24/23 2.  Reassuring fetal and maternal status 3.  No cervical change x  2 hrs   PLAN 1. Current condition and above findings reviewed.  Reassuring fetal and maternal condition. 2. Discharge home with standard labor precautions given to return to L&D or call the office for problems. 3. Continue routine prenatal care.    Doreene Burke, CNM

## 2023-01-17 NOTE — Progress Notes (Signed)
Pt presents to L/D triage with reported intermittent abdominal pain ongoing since 0800- rated 10/10. Pt reports that nothing relieves the pain. She reports positive fetal movement and no bleeding or LOF. Monitors applied and assessing. VSS. Pt PO hydrating and eating.  Pt turned left lateral. SVE performed by RN- will recheck in two hours to assess for cervical change.

## 2023-01-17 NOTE — OB Triage Note (Signed)
Pt discharged home per order.   Pt stable and ambulatory and an After Visit Summary was printed and given to the patient. Discharge education completed with patient including follow up instructions, appointments, and medication list. Pt received labor and bleeding precautions. Patient able to verbalize understanding, all questions fully answered upon discharge. . Pt discharged home via personal vehicle. Signs of labor discussed.

## 2023-01-19 ENCOUNTER — Other Ambulatory Visit: Payer: Self-pay

## 2023-01-19 ENCOUNTER — Observation Stay (HOSPITAL_BASED_OUTPATIENT_CLINIC_OR_DEPARTMENT_OTHER)
Admission: EM | Admit: 2023-01-19 | Discharge: 2023-01-19 | Disposition: A | Discharge status: Home / Self Care | Payer: Medicaid Other | Source: Home / Self Care | Admitting: Family

## 2023-01-19 ENCOUNTER — Encounter: Payer: Self-pay | Admitting: Obstetrics and Gynecology

## 2023-01-19 DIAGNOSIS — Z87891 Personal history of nicotine dependence: Secondary | ICD-10-CM | POA: Insufficient documentation

## 2023-01-19 DIAGNOSIS — O471 False labor at or after 37 completed weeks of gestation: Secondary | ICD-10-CM | POA: Insufficient documentation

## 2023-01-19 DIAGNOSIS — R109 Unspecified abdominal pain: Secondary | ICD-10-CM | POA: Diagnosis not present

## 2023-01-19 DIAGNOSIS — Z79899 Other long term (current) drug therapy: Secondary | ICD-10-CM | POA: Insufficient documentation

## 2023-01-19 DIAGNOSIS — O99013 Anemia complicating pregnancy, third trimester: Secondary | ICD-10-CM

## 2023-01-19 DIAGNOSIS — O479 False labor, unspecified: Secondary | ICD-10-CM | POA: Diagnosis present

## 2023-01-19 DIAGNOSIS — O99210 Obesity complicating pregnancy, unspecified trimester: Secondary | ICD-10-CM

## 2023-01-19 DIAGNOSIS — O26893 Other specified pregnancy related conditions, third trimester: Secondary | ICD-10-CM | POA: Diagnosis not present

## 2023-01-19 DIAGNOSIS — Z3A39 39 weeks gestation of pregnancy: Secondary | ICD-10-CM | POA: Diagnosis not present

## 2023-01-19 DIAGNOSIS — O0990 Supervision of high risk pregnancy, unspecified, unspecified trimester: Secondary | ICD-10-CM

## 2023-01-19 NOTE — OB Triage Note (Signed)

## 2023-01-19 NOTE — OB Triage Note (Signed)
Pt Dawn Richards [name] 27 y.o. presents to labor and delivery triage reporting intense contractions every 2-\4 mins. They started to intensify around 1am this morning. Pt is a G2P1001 at [redacted]w[redacted]d. Pt denies signs and symptoms consistent with rupture of membranes or active vaginal bleeding. Pt  states positive fetal movement. External FM and TOCO applied to non-tender abdomen and assessing. Initial FHR . Vital signs obtained and within normal limits. Provider notified of pt.

## 2023-01-19 NOTE — Final Progress Note (Signed)
OB/Triage Note  Patient ID: Dawn Richards MRN: [id number] DOB/AGE: [birth year] 27 y.o.  Subjective  History of Present Illness: The patient is a 27 y.o. female G2P1001 at [redacted]w[redacted]d who presents for labor evaluation. Reports painful contractions that started about 0100, and are now q2-87min, rates pain 4-5/10 with contractions. Reports good fetal movement. Denies LOF, VB, visual changes, HA, RUQ pain.  Was seen two days ago for labor eval and cervix was 1.5cm dilated at that time. Has scheduled IOL on 6/15 for elevated BMI.   Past Medical History:  Diagnosis Date   DUB (dysfunctional uterine bleeding)    Pelvic pain     Past Surgical History:  Procedure Laterality Date   WISDOM TOOTH EXTRACTION     four; age [age]    No current facility-administered medications on file prior to encounter.   Current Outpatient Medications on File Prior to Encounter  Medication Sig Dispense Refill   acyclovir (ZOVIRAX) 800 MG tablet Take 1 tablet (800 mg total) by mouth 5 (five) times daily. 30 tablet 11   ondansetron (ZOFRAN-ODT) 4 MG disintegrating tablet Take 1 tablet (4 mg total) by mouth every 8 (eight) hours as needed for nausea or vomiting. (Patient not taking: Reported on 01/17/2023) 20 tablet 0   Prenatal Vit-Fe Fumarate-FA (PRENATAL VITAMINS) 28-0.8 MG TABS Take 1 tablet by mouth daily. 30 tablet 11   valACYclovir (VALTREX) 500 MG tablet Take 1 tablet (500 mg total) by mouth 2 (two) times daily. 30 tablet 6    No Known Allergies  Social History   Socioeconomic History   Marital status: Significant Other    Spouse name: [name]   Number of children: 1   Years of education: 12   Highest education level: Not on file  Occupational History   Occupation: fast food- on feet all day  Tobacco Use   Smoking status: Former    Types: Cigarettes    Quit date: 05/23/2022    Years since quitting: 0.6   Smokeless tobacco: Never  Vaping Use   Vaping Use: Former   Substances:  Nicotine, Flavoring  Substance and Sexual Activity   Alcohol use: Not Currently    Comment: ocassionally   Drug use: Not Currently    Types: Marijuana    Comment: daily   Sexual activity: Yes    Partners: Male    Birth control/protection: I.U.D.  Other Topics Concern   Not on file  Social History Narrative   Not on file   Social Determinants of Health   Financial Resource Strain: Medium Risk (06/18/2022)   Overall Financial Resource Strain (CARDIA)    Difficulty of Paying Living Expenses: Somewhat hard  Food Insecurity: Food Insecurity Present (06/18/2022)   Hunger Vital Sign    Worried About Running Out of Food in the Last Year: Sometimes true    Ran Out of Food in the Last Year: Sometimes true  Transportation Needs: No Transportation Needs (06/18/2022)   PRAPARE - Administrator, Civil Service (Medical): No    Lack of Transportation (Non-Medical): No  Physical Activity: Insufficiently Active (06/18/2022)   Exercise Vital Sign    Days of Exercise per Week: 2 days    Minutes of Exercise per Session: 20 min  Stress: Stress Concern Present (06/18/2022)   [employer] of Occupational Health - Occupational Stress Questionnaire    Feeling of Stress : To some extent  Social Connections: Moderately Isolated (06/18/2022)   Social Connection and Isolation Panel [NHANES]  Frequency of Communication with Friends and Family: More than three times a week    Frequency of Social Gatherings with Friends and Family: Twice a week    Attends Religious Services: Never    Database administrator or Organizations: No    Attends Banker Meetings: Never    Marital Status: Living with partner  Intimate Partner Violence: Not At Risk (06/18/2022)   Humiliation, Afraid, Rape, and Kick questionnaire    Fear of Current or Ex-Partner: No    Emotionally Abused: No    Physically Abused: No    Sexually Abused: No    Family History  Problem Relation Age of Onset   Diabetes  Mother    Kidney failure Father    Cancer Paternal Grandmother        breast   Breast cancer Neg Hx    Ovarian cancer Neg Hx    Colon cancer Neg Hx    Heart disease Neg Hx      ROS    Objective  Physical Exam: BP 131/80 (BP Location: Left Arm)   Pulse (!) 103   Temp 98.5 F (36.9 C) (Oral)   Resp 18   Ht 5' (1.524 m)   Wt 100.7 kg   LMP 04/12/2022 (Exact Date)   BMI 43.36 kg/m   OBGyn Exam  FHT 150, mod variability, pos accels, rare isolated decels with quick return to baseline Toco Ucs 4-35min  SVE 2cm per RN, unchanged after two hours   Hospital Course: The patient was admitted to [facility] Triage for observation. Had reactive NST, initial cervical exam and then monitored. Repeat cervical exam after two hours was unchanged. Additionally patient was napping and had to be awoken by staff. She asked if she could be induced today, discussed that we do not do elective inductions. She does have an IOL scheduled for 01/24/23 for elevated BMI. Labor precautions discussed  Assessment: 27 y.o. female G2P1001 at [redacted]w[redacted]d  RNST Not in labor  Plan: Discharge home Follow up at next ROB Teaching: labor precautions  Discharge Instructions     Discharge activity:  No Restrictions   Complete by: As directed    Discharge diet:  No restrictions   Complete by: As directed    LABOR:  When conractions begin, you should start to time them from the beginning of one contraction to the beginning  of the next.  When contractions are 5 - 10 minutes apart or less and have been regular for at least an hour, you should call your health care provider.   Complete by: As directed    No sexual activity restrictions   Complete by: As directed    Notify physician for bleeding from the vagina   Complete by: As directed    Notify physician for blurring of vision or spots before the eyes   Complete by: As directed    Notify physician for chills or fever   Complete by: As directed    Notify physician for  fainting spells, "black outs" or loss of consciousness   Complete by: As directed    Notify physician for increase in vaginal discharge   Complete by: As directed    Notify physician for leaking of fluid   Complete by: As directed    Notify physician for pain or burning when urinating   Complete by: As directed    Notify physician for pelvic pressure (sudden increase)   Complete by: As directed    Notify physician for severe  or continued nausea or vomiting   Complete by: As directed    Notify physician for sudden gushing of fluid from the vagina (with or without continued leaking)   Complete by: As directed    Notify physician for sudden, constant, or occasional abdominal pain   Complete by: As directed    Notify physician if baby moving less than usual   Complete by: As directed       Allergies as of 01/19/2023   No Known Allergies      Medication List     ASK your doctor about these medications    acyclovir 800 MG tablet Commonly known as: ZOVIRAX Take 1 tablet (800 mg total) by mouth 5 (five) times daily.   ondansetron 4 MG disintegrating tablet Commonly known as: ZOFRAN-ODT Take 1 tablet (4 mg total) by mouth every 8 (eight) hours as needed for nausea or vomiting.   Prenatal Vitamins 28-0.8 MG Tabs Take 1 tablet by mouth daily.   valACYclovir 500 MG tablet Commonly known as: VALTREX Take 1 tablet (500 mg total) by mouth 2 (two) times daily.         Total time spent taking care of this patient: 45 minutes  Signed: Raeford Razor CNM, FNP 01/19/2023, 1:53 PM

## 2023-01-19 NOTE — Progress Notes (Signed)
   01/19/23 1056  Fetal Heart Rate A  Mode External  Baseline Rate (A) 150 bpm  Variability 6-25 BPM  Accelerations 15 x 15  Decelerations None  Uterine Activity  Mode Toco;Palpation  Contraction Frequency (min) 6-6.5  Contraction Duration (sec) 80-110  Contraction Quality Moderate  Resting Tone Palpated Relaxed  Resting Time Adequate    Category 1 NST.

## 2023-01-20 ENCOUNTER — Inpatient Hospital Stay
Admission: EM | Admit: 2023-01-20 | Discharge: 2023-01-22 | DRG: 806 | Disposition: A | Discharge status: Home / Self Care | Payer: Medicaid Other | Attending: Obstetrics | Admitting: Obstetrics

## 2023-01-20 ENCOUNTER — Inpatient Hospital Stay: Payer: Medicaid Other | Admitting: Anesthesiology

## 2023-01-20 ENCOUNTER — Other Ambulatory Visit: Payer: Self-pay

## 2023-01-20 ENCOUNTER — Encounter: Payer: Self-pay | Admitting: Obstetrics and Gynecology

## 2023-01-20 DIAGNOSIS — Z87891 Personal history of nicotine dependence: Secondary | ICD-10-CM | POA: Diagnosis not present

## 2023-01-20 DIAGNOSIS — O26893 Other specified pregnancy related conditions, third trimester: Secondary | ICD-10-CM | POA: Diagnosis not present

## 2023-01-20 DIAGNOSIS — O99013 Anemia complicating pregnancy, third trimester: Principal | ICD-10-CM

## 2023-01-20 DIAGNOSIS — E663 Overweight: Secondary | ICD-10-CM | POA: Diagnosis not present

## 2023-01-20 DIAGNOSIS — O99214 Obesity complicating childbirth: Secondary | ICD-10-CM | POA: Diagnosis not present

## 2023-01-20 DIAGNOSIS — Z23 Encounter for immunization: Secondary | ICD-10-CM | POA: Diagnosis not present

## 2023-01-20 DIAGNOSIS — Z3A39 39 weeks gestation of pregnancy: Secondary | ICD-10-CM

## 2023-01-20 DIAGNOSIS — A6000 Herpesviral infection of urogenital system, unspecified: Secondary | ICD-10-CM | POA: Diagnosis not present

## 2023-01-20 DIAGNOSIS — Z3A00 Weeks of gestation of pregnancy not specified: Secondary | ICD-10-CM | POA: Diagnosis not present

## 2023-01-20 DIAGNOSIS — O9832 Other infections with a predominantly sexual mode of transmission complicating childbirth: Secondary | ICD-10-CM | POA: Diagnosis present

## 2023-01-20 DIAGNOSIS — Z6841 Body Mass Index (BMI) 40.0 and over, adult: Secondary | ICD-10-CM

## 2023-01-20 LAB — CBC
HCT: 31.2 % — ABNORMAL LOW (ref 36.0–46.0)
Hemoglobin: 10.3 g/dL — ABNORMAL LOW (ref 12.0–15.0)
MCH: 28.6 pg (ref 26.0–34.0)
MCHC: 33 g/dL (ref 30.0–36.0)
MCV: 86.7 fL (ref 80.0–100.0)
Platelets: 250 10*3/uL (ref 150–400)
RBC: 3.6 MIL/uL — ABNORMAL LOW (ref 3.87–5.11)
RDW: 13.2 % (ref 11.5–15.5)
WBC: 10.5 10*3/uL (ref 4.0–10.5)
nRBC: 0 % (ref 0.0–0.2)

## 2023-01-20 LAB — TYPE AND SCREEN
ABO/RH(D): A POS
Antibody Screen: NEGATIVE

## 2023-01-20 LAB — RUPTURE OF MEMBRANE (ROM)PLUS: Rom Plus: NEGATIVE

## 2023-01-20 MED ORDER — PRENATAL MULTIVITAMIN CH
1.0000 | ORAL_TABLET | Freq: Every day | ORAL | Status: DC
  Administered 2023-01-21: 1 via ORAL
  Filled 2023-01-20: qty 1

## 2023-01-20 MED ORDER — FERROUS SULFATE 325 (65 FE) MG PO TABS
325.0000 mg | ORAL_TABLET | Freq: Two times a day (BID) | ORAL | Status: DC
  Administered 2023-01-21 – 2023-01-22 (×3): 325 mg via ORAL
  Filled 2023-01-20 (×3): qty 1

## 2023-01-20 MED ORDER — SOD CITRATE-CITRIC ACID 500-334 MG/5ML PO SOLN: 30.0000 mL | ORAL | Status: DC | PRN

## 2023-01-20 MED ORDER — TERBUTALINE SULFATE 1 MG/ML IJ SOLN: 0.2500 mg | Freq: Once | INTRAMUSCULAR | Status: DC | PRN

## 2023-01-20 MED ORDER — WITCH HAZEL-GLYCERIN EX PADS
MEDICATED_PAD | CUTANEOUS | Status: DC | PRN
  Filled 2023-01-20: qty 100

## 2023-01-20 MED ORDER — BENZOCAINE-MENTHOL 20-0.5 % EX AERO
INHALATION_SPRAY | CUTANEOUS | Status: AC
  Administered 2023-01-20: 1 via TOPICAL
  Filled 2023-01-20: qty 56

## 2023-01-20 MED ORDER — OXYTOCIN BOLUS FROM INFUSION
333.0000 mL | Freq: Once | INTRAVENOUS | Status: AC
  Administered 2023-01-20: 333 mL via INTRAVENOUS

## 2023-01-20 MED ORDER — LACTATED RINGERS IV SOLN
500.0000 mL | Freq: Once | INTRAVENOUS | Status: AC
  Administered 2023-01-20: 500 mL via INTRAVENOUS

## 2023-01-20 MED ORDER — OXYTOCIN-SODIUM CHLORIDE 30-0.9 UT/500ML-% IV SOLN
1.0000 m[IU]/min | INTRAVENOUS | Status: DC
  Administered 2023-01-20: 2 m[IU]/min via INTRAVENOUS
  Filled 2023-01-20: qty 500

## 2023-01-20 MED ORDER — ACETAMINOPHEN 325 MG PO TABS
650.0000 mg | ORAL_TABLET | ORAL | Status: DC | PRN
  Administered 2023-01-20: 650 mg via ORAL

## 2023-01-20 MED ORDER — DOCUSATE SODIUM 100 MG PO CAPS
100.0000 mg | ORAL_CAPSULE | Freq: Two times a day (BID) | ORAL | Status: DC
  Administered 2023-01-21 – 2023-01-22 (×3): 100 mg via ORAL
  Filled 2023-01-20 (×4): qty 1

## 2023-01-20 MED ORDER — COCONUT OIL OIL: 1.0000 | TOPICAL_OIL | Status: DC | PRN

## 2023-01-20 MED ORDER — AMMONIA AROMATIC IN INHA
RESPIRATORY_TRACT | Status: AC
  Filled 2023-01-20: qty 10

## 2023-01-20 MED ORDER — TETANUS-DIPHTH-ACELL PERTUSSIS 5-2.5-18.5 LF-MCG/0.5 IM SUSY: 0.5000 mL | PREFILLED_SYRINGE | Freq: Once | INTRAMUSCULAR | Status: DC

## 2023-01-20 MED ORDER — PHENYLEPHRINE 80 MCG/ML (10ML) SYRINGE FOR IV PUSH (FOR BLOOD PRESSURE SUPPORT): 80.0000 ug | PREFILLED_SYRINGE | INTRAVENOUS | Status: DC | PRN

## 2023-01-20 MED ORDER — LIDOCAINE HCL (PF) 1 % IJ SOLN
INTRAMUSCULAR | Status: AC
  Filled 2023-01-20: qty 30

## 2023-01-20 MED ORDER — FENTANYL-BUPIVACAINE-NACL 0.5-0.125-0.9 MG/250ML-% EP SOLN
EPIDURAL | Status: AC
  Filled 2023-01-20: qty 250

## 2023-01-20 MED ORDER — EPHEDRINE 5 MG/ML INJ: 10.0000 mg | INTRAVENOUS | Status: DC | PRN

## 2023-01-20 MED ORDER — HYDROXYZINE HCL 25 MG PO TABS: 50.0000 mg | ORAL_TABLET | Freq: Four times a day (QID) | ORAL | Status: DC | PRN

## 2023-01-20 MED ORDER — METHYLERGONOVINE MALEATE 0.2 MG PO TABS: 0.2000 mg | ORAL_TABLET | ORAL | Status: DC | PRN

## 2023-01-20 MED ORDER — OXYTOCIN-SODIUM CHLORIDE 30-0.9 UT/500ML-% IV SOLN: 2.5000 [IU]/h | INTRAVENOUS | Status: DC | PRN

## 2023-01-20 MED ORDER — OXYTOCIN 10 UNIT/ML IJ SOLN
INTRAMUSCULAR | Status: AC
  Filled 2023-01-20: qty 2

## 2023-01-20 MED ORDER — OXYCODONE-ACETAMINOPHEN 5-325 MG PO TABS: 1.0000 | ORAL_TABLET | ORAL | Status: DC | PRN

## 2023-01-20 MED ORDER — MEASLES, MUMPS & RUBELLA VAC IJ SOLR
0.5000 mL | Freq: Once | INTRAMUSCULAR | Status: AC
  Administered 2023-01-22: 0.5 mL via SUBCUTANEOUS
  Filled 2023-01-20 (×2): qty 0.5

## 2023-01-20 MED ORDER — DIPHENHYDRAMINE HCL 50 MG/ML IJ SOLN: 12.5000 mg | INTRAMUSCULAR | Status: DC | PRN

## 2023-01-20 MED ORDER — OXYTOCIN-SODIUM CHLORIDE 30-0.9 UT/500ML-% IV SOLN
2.5000 [IU]/h | INTRAVENOUS | Status: DC
  Administered 2023-01-20: 2.5 [IU]/h via INTRAVENOUS

## 2023-01-20 MED ORDER — ONDANSETRON HCL 4 MG PO TABS: 4.0000 mg | ORAL_TABLET | ORAL | Status: DC | PRN

## 2023-01-20 MED ORDER — LIDOCAINE HCL (PF) 1 % IJ SOLN
INTRAMUSCULAR | Status: DC | PRN
  Administered 2023-01-20: 2 mL via SUBCUTANEOUS

## 2023-01-20 MED ORDER — ONDANSETRON HCL 4 MG/2ML IJ SOLN
4.0000 mg | Freq: Four times a day (QID) | INTRAMUSCULAR | Status: DC | PRN
  Administered 2023-01-20: 4 mg via INTRAVENOUS
  Filled 2023-01-20: qty 2

## 2023-01-20 MED ORDER — LIDOCAINE HCL (PF) 1 % IJ SOLN
30.0000 mL | INTRAMUSCULAR | Status: DC | PRN
  Filled 2023-01-20: qty 30

## 2023-01-20 MED ORDER — ZOLPIDEM TARTRATE 5 MG PO TABS: 5.0000 mg | ORAL_TABLET | Freq: Every evening | ORAL | Status: DC | PRN

## 2023-01-20 MED ORDER — LACTATED RINGERS IV SOLN: 500.0000 mL | INTRAVENOUS | Status: DC | PRN

## 2023-01-20 MED ORDER — SIMETHICONE 80 MG PO CHEW: 80.0000 mg | CHEWABLE_TABLET | ORAL | Status: DC | PRN

## 2023-01-20 MED ORDER — FENTANYL-BUPIVACAINE-NACL 0.5-0.125-0.9 MG/250ML-% EP SOLN
12.0000 mL/h | EPIDURAL | Status: DC | PRN
  Administered 2023-01-20: 12 mL/h via EPIDURAL

## 2023-01-20 MED ORDER — FENTANYL CITRATE (PF) 100 MCG/2ML IJ SOLN
50.0000 ug | INTRAMUSCULAR | Status: DC | PRN
  Administered 2023-01-20: 50 ug via INTRAVENOUS
  Administered 2023-01-20: 100 ug via INTRAVENOUS
  Filled 2023-01-20 (×2): qty 2

## 2023-01-20 MED ORDER — DIPHENHYDRAMINE HCL 25 MG PO CAPS: 25.0000 mg | ORAL_CAPSULE | Freq: Four times a day (QID) | ORAL | Status: DC | PRN

## 2023-01-20 MED ORDER — METHYLERGONOVINE MALEATE 0.2 MG/ML IJ SOLN: 0.2000 mg | INTRAMUSCULAR | Status: DC | PRN

## 2023-01-20 MED ORDER — ACETAMINOPHEN 325 MG PO TABS
650.0000 mg | ORAL_TABLET | ORAL | Status: DC | PRN
  Filled 2023-01-20: qty 2

## 2023-01-20 MED ORDER — ONDANSETRON HCL 4 MG/2ML IJ SOLN: 4.0000 mg | INTRAMUSCULAR | Status: DC | PRN

## 2023-01-20 MED ORDER — BENZOCAINE-MENTHOL 20-0.5 % EX AERO: 1.0000 | INHALATION_SPRAY | CUTANEOUS | Status: DC | PRN

## 2023-01-20 MED ORDER — LACTATED RINGERS IV SOLN: INTRAVENOUS | Status: DC

## 2023-01-20 MED ORDER — IBUPROFEN 600 MG PO TABS
600.0000 mg | ORAL_TABLET | Freq: Four times a day (QID) | ORAL | Status: DC
  Administered 2023-01-21 – 2023-01-22 (×6): 600 mg via ORAL
  Filled 2023-01-20 (×6): qty 1

## 2023-01-20 MED ORDER — MISOPROSTOL 200 MCG PO TABS
ORAL_TABLET | ORAL | Status: AC
  Filled 2023-01-20: qty 4

## 2023-01-20 MED ORDER — LIDOCAINE-EPINEPHRINE (PF) 1.5 %-1:200000 IJ SOLN
INTRAMUSCULAR | Status: DC | PRN
  Administered 2023-01-20: 3 mL via EPIDURAL

## 2023-01-20 MED ORDER — IBUPROFEN 600 MG PO TABS
600.0000 mg | ORAL_TABLET | Freq: Four times a day (QID) | ORAL | Status: DC
  Administered 2023-01-20: 600 mg via ORAL
  Filled 2023-01-20: qty 1

## 2023-01-20 NOTE — Progress Notes (Signed)
LABOR NOTE   SUBJECTIVE:   Dawn Richards [name] is a 27 y.o.  G2P1001  at [redacted]w[redacted]d whose labor is being augmented for prolonged latent phase. She is comfortable with epidural. She is receiving Pitocin at 6 mU/min. We discussed AROM, and she is in agreement with this plan.  Analgesia: Epidural  OBJECTIVE:  BP (!) 105/57   Pulse (!) 114   Temp 98.1 F (36.7 C) (Oral)   Resp 18   Ht 5' (1.524 m)   Wt 100.7 kg   LMP 04/12/2022 (Exact Date)   SpO2 94%   BMI 43.36 kg/m  No intake/output data recorded.  SVE:   Dilation: 6.5 Effacement (%): 80 Station: 0 Exam by:: Quitman Livings CNM CONTRACTIONS: regular, every 2-3 minutes FHR: Fetal heart tracing reviewed. Baseline: 150 Variability: moderate Accelerations: present Decelerations: late decels after epidural, resolved Category I/II  Labs: Lab Results  Component Value Date   WBC 10.5 01/20/2023   HGB 10.3 (L) 01/20/2023   HCT 31.2 (L) 01/20/2023   MCV 86.7 01/20/2023   PLT 250 01/20/2023    ASSESSMENT: -Augmentation of labor d/t prolonged latent phase, progressing well -Coping well -Membranes: ruptured, light meconium     Active Problems:   Labor and delivery, indication for care   BMI 40.0-44.9, adult [facility])   PLAN: Continue Pitocin augmentation Anticipate NSVD  [name], CNM 01/20/2023 3:31 PM

## 2023-01-20 NOTE — Anesthesia Preprocedure Evaluation (Signed)
Anesthesia Evaluation  Patient identified by MRN, date of birth, ID band Patient awake    Reviewed: Allergy & Precautions, H&P , NPO status , Patient's Chart, lab work & pertinent test results  Airway Mallampati: II  TM Distance: >3 FB Neck ROM: full    Dental no notable dental hx.    Pulmonary neg pulmonary ROS, former smoker   Pulmonary exam normal        Cardiovascular Exercise Tolerance: Good negative cardio ROS Normal cardiovascular exam     Neuro/Psych    GI/Hepatic negative GI ROS,,,  Endo/Other    Renal/GU   negative genitourinary   Musculoskeletal   Abdominal  (+) + obese  Peds  Hematology negative hematology ROS (+)   Anesthesia Other Findings Past Medical History: No date: DUB (dysfunctional uterine bleeding) No date: Pelvic pain  Past Surgical History: No date: WISDOM TOOTH EXTRACTION     Comment:  [age]; age [age]  BMI    Body Mass Index: 43.36 kg/m      Reproductive/Obstetrics (+) Pregnancy                             Anesthesia Physical Anesthesia Plan  ASA: 3  Anesthesia Plan: Epidural   Post-op Pain Management:    Induction:   PONV Risk Score and Plan:   Airway Management Planned:   Additional Equipment:   Intra-op Plan:   Post-operative Plan:   Informed Consent: I have reviewed the patients History and Physical, chart, labs and discussed the procedure including the risks, benefits and alternatives for the proposed anesthesia with the patient or authorized representative who has indicated his/her understanding and acceptance.       Plan Discussed with: Anesthesiologist and CRNA  Anesthesia Plan Comments:        Anesthesia Quick Evaluation

## 2023-01-20 NOTE — Progress Notes (Signed)
Pt presents to L/D triage with reported contractions that began to increase in strength and intensity since 0300 this am. She reports pain 10/10, unrelieved by rest.  Monitors applied and assessing. VSS. Pt reports positive fetal movement and possible LOF with light spotting. ROM+ collected.  Initial SVE 3.5/60/-3. Pt PO hydrating. CNM notified- labor eval initiated.

## 2023-01-20 NOTE — Discharge Summary (Signed)
Postpartum Discharge Summary  Date of Service updated***     Patient Name: Dawn Richards DOB: 18-Jan-1996 MRN: [id number]  Date of admission: 01/20/2023 Delivery date:01/20/2023  Delivering provider: Glenetta Borg  Date of discharge: ***  Admitting diagnosis: Labor and delivery, indication for care [O75.9] Intrauterine pregnancy: [redacted]w[redacted]d     Secondary diagnosis:  Active Problems:   Labor and delivery, indication for care   BMI 40.0-44.9, adult [facility])  Additional problems: None    Discharge diagnosis: Term Pregnancy Delivered                                              Post partum procedures:{Postpartum procedures:23558} Augmentation: AROM and Pitocin Complications: None  Hospital course: Onset of Labor With Vaginal Delivery      27 y.o. yo G2P1001 at [redacted]w[redacted]d was admitted in Latent Labor on 01/20/2023. Labor course was uncomplicated. Membrane Rupture Time/Date: 3:23 PM ,01/20/2023   Delivery Method:Vaginal, Spontaneous  Episiotomy: None  Lacerations: vaginal, left labial Patient had a postpartum course complicated by ***.  She is ambulating, tolerating a regular diet, passing flatus, and urinating well. Patient is discharged home in stable condition on ***.  Newborn Data: Birth date:01/20/2023  Birth time:7:13 PM  Gender:Female  Living status:Living  Apgars:8 ,9  Weight:   Magnesium Sulfate received: No BMZ received: No Rhophylac:N/A MMR:{MMR:30440033} T-DaP:Given prenatally Flu: No Transfusion:{Transfusion received:30440034}  Physical exam  Vitals:   01/20/23 1730 01/20/23 1850 01/20/23 1852 01/20/23 1855  BP:  (!) 117/53 (!) 117/53   Pulse:  89 89   Resp:      Temp: 97.8 F (36.6 C)     TempSrc: Oral     SpO2:  100% 100% 99%  Weight:      Height:       General: {Exam; general:21111117} Lochia: {Desc; appropriate/inappropriate:30686::"appropriate"} Uterine Fundus: {Desc; firm/soft:30687} Incision: {Exam; incision:21111123} DVT  Evaluation: {Exam; dvt:2111122} Labs: Lab Results  Component Value Date   WBC 10.5 01/20/2023   HGB 10.3 (L) 01/20/2023   HCT 31.2 (L) 01/20/2023   MCV 86.7 01/20/2023   PLT 250 01/20/2023      Latest Ref Rng & Units 12/21/2022   10:29 AM  CMP  Glucose 70 - 99 mg/dL 308   BUN 6 - 20 mg/dL 6   Creatinine 6.57 - 8.46 mg/dL 9.62   Sodium 952 - 841 mmol/L 134   Potassium 3.5 - 5.1 mmol/L 3.2   Chloride 98 - 111 mmol/L 107   CO2 22 - 32 mmol/L 18   Calcium 8.9 - 10.3 mg/dL 8.1   Total Protein 6.5 - 8.1 g/dL 6.2   Total Bilirubin 0.3 - 1.2 mg/dL 0.5   Alkaline Phos 38 - 126 U/L 102   AST 15 - 41 U/L 14   ALT 0 - 44 U/L 8    Edinburgh Score:    06/18/2022   10:33 AM  Edinburgh Postnatal Depression Scale Screening Tool  I have been able to laugh and see the funny side of things. 0  I have looked forward with enjoyment to things. 0  I have blamed myself unnecessarily when things went wrong. 2  I have been anxious or worried for no good reason. 0  I have felt scared or panicky for no good reason. 0  Things have been getting on top of me. 0  I have  been so unhappy that I have had difficulty sleeping. 0  I have felt sad or miserable. 0  I have been so unhappy that I have been crying. 0  The thought of harming myself has occurred to me. 0  Edinburgh Postnatal Depression Scale Total 2      After visit meds:  Allergies as of 01/20/2023   No Known Allergies   Med Rec must be completed prior to using this [facility]***        Discharge home in stable condition Infant Feeding: {Baby feeding:23562} Infant Disposition:{CHL IP OB HOME WITH WUJWJX:91478} Discharge instruction: per After Visit Summary and Postpartum booklet. Activity: Advance as tolerated. Pelvic rest for 6 weeks.  Diet: {OB diet:21111121} Anticipated Birth Control: {Birth Control:23956} Postpartum Appointment:{Outpatient follow up:23559} Additional Postpartum F/U: {PP Procedure:23957} Future Appointments:No  future appointments. Follow up Visit:

## 2023-01-20 NOTE — Anesthesia Procedure Notes (Signed)
Epidural Patient location during procedure: OB Start time: 01/20/2023 2:18 PM End time: 01/20/2023 2:32 PM  Staffing Anesthesiologist: Foye Deer, MD Performed: anesthesiologist   Preanesthetic Checklist Completed: patient identified, IV checked, site marked, risks and benefits discussed, surgical consent, monitors and equipment checked, pre-op evaluation and timeout performed  Epidural Patient position: sitting Prep: ChloraPrep Patient monitoring: heart rate, continuous pulse ox and blood pressure Approach: midline Location: L3-L4 Injection technique: LOR saline  Needle:  Needle type: Tuohy  Needle gauge: 18 G Needle length: 9 cm Needle insertion depth: 6 cm Catheter type: closed end Catheter size: 20 Guage Catheter at skin depth: 11 cm Test dose: negative and 1.5% lidocaine with Epi 1:200 K  Assessment Events: blood not aspirated, no cerebrospinal fluid, injection not painful, no injection resistance and no paresthesia  Additional Notes Reason for block:procedure for pain

## 2023-01-20 NOTE — H&P (Cosign Needed Addendum)
History and Physical   HPI  IRIDESSA HARROW [name] is a 27 y.o. G2P1001 at [redacted]w[redacted]d Estimated Date of Delivery: 01/24/23 who is being admitted for augmentation. She has been having contractions on and off since Saturday and has dilated from about 1 cm to 4 cm over the course of several days. Contractions became stronger around 0300 today. She denies LOF. She endorses good fetal movement and has had a small amount of spotting/blood show. She desires labor augmentation due to exhaustion and prolonged latent phase.     OB History  OB History  Gravida Para Term Preterm AB Living  2 1 1  0 0 1  SAB IAB Ectopic Multiple Live Births  0 0 0 0 1    # Outcome Date GA Lbr Len/2nd Weight Sex Delivery Anes PTL Lv  2 Current           1 Term 01/31/20 [redacted]w[redacted]d / 00:30 3270 g F Vag-Spont None  LIV     Name: SHARDAE, KLEINMAN Talar     Apgar1: 7  Apgar5: 9    PROBLEM LIST  Pregnancy complications or risks: Patient Active Problem List   Diagnosis Date Noted   BMI 40.0-44.9, adult (HCC) 01/20/2023   Uterine contractions 01/19/2023   Labor and delivery, indication for care 01/17/2023   BV (bacterial vaginosis) 01/07/2023   Anemia affecting pregnancy in third trimester 11/17/2022   Late prenatal care affecting pregnancy in second trimester 10/07/2022   Supervision of other high risk pregnancies, unspecified trimester 09/08/2022   Genital herpes simplex type 2 11/20/2021   Obesity in pregnancy 09/05/2019   Supervision of high risk pregnancy, antepartum 06/22/2019   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   NSAID overdose, intentional self-harm, sequela (HCC) 05/08/2017   Female dyspareunia 08/13/2016   Dysmenorrhea 08/13/2016   Abnormal uterine bleeding (AUB) 08/13/2016    Prenatal labs and studies: ABO, Rh: A/Positive/-- (11/13 1019) Antibody: Negative (11/13 1019) Rubella: <0.90 (11/13 1019) RPR: Non Reactive (03/26 0951)  HBsAg:  Negative (11/13 1019)  HIV: Non Reactive (03/26 0951)  ZOX:WRUEAVWU/-- (05/13 1055)   Past Medical History:  Diagnosis Date   DUB (dysfunctional uterine bleeding)    Pelvic pain      Past Surgical History:  Procedure Laterality Date   WISDOM TOOTH EXTRACTION     [age]; age 33/17     Medications    Current Discharge Medication List     CONTINUE these medications which have NOT CHANGED   Details  acyclovir (ZOVIRAX) 800 MG tablet Take 1 tablet (800 mg total) by mouth 5 (five) times daily. Qty: 30 tablet, Refills: 11   Associated Diagnoses: Genital herpes simplex type 2    Prenatal Vit-Fe Fumarate-FA (PRENATAL VITAMINS) 28-0.8 MG TABS Take 1 tablet by mouth daily. Qty: 30 tablet, Refills: 11    valACYclovir (VALTREX) 500 MG tablet Take 1 tablet (500 mg total) by mouth 2 (two) times daily. Qty: 30 tablet, Refills: 6    ondansetron (ZOFRAN-ODT) 4 MG disintegrating tablet Take 1 tablet (4 mg total) by mouth every 8 (eight) hours as needed for nausea or vomiting. Qty: 20 tablet, Refills: 0         Allergies  Patient has no known allergies.  Review of Systems  Constitutional: negative Ears, nose, mouth, throat, and face: negative Respiratory: negative Cardiovascular: negative Gastrointestinal: negative Genitourinary:uterine contractions Integument/breast: negative Musculoskeletal:negative Behavioral/Psych: negative  Physical Exam  BP 112/66 (BP Location: Right Arm)   Pulse 89   Temp  97.8 F (36.6 C) (Oral)   Resp 18   Ht 5' (1.524 m)   Wt 100.7 kg   LMP 04/12/2022 (Exact Date)   BMI 43.36 kg/m   General: Lungs:  CTAB Cardio: RRR without M/R/G Abd: Soft, gravid, NT Presentation: cephalic Pelvic: no HSV lesions noted  CERVIX: Dilation: 4 Effacement (%): 60, 70 Cervical Position: Posterior Station: -2 Presentation: Vertex Exam by:: Christean Leaf RN  See Prenatal records for more detailed PE.     FHR:  Baseline: 145 Variability:  moderate Accelerations: present Decelerations: none Toco: irregular, every 3-5 minutes Category 1  Test Results  Results for orders placed or performed during the hospital encounter of 01/20/23 (from the past 24 hour(s))  ROM Plus ([facility] only)     Status: None   Collection Time: 01/20/23  7:18 AM  Result Value Ref Range   Rom Plus NEGATIVE    Group B Strep negative  Assessment   G2P1001 at [redacted]w[redacted]d Estimated Date of Delivery: 01/24/23  Reassuring maternal/fetal status. 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 Admit to L&D  2. EFM: Per protocol -- Category 1 3. Augmentation with Pitocin and AROM  4. Pharmacologic pain relief if desired.   5. Admission labs  6. Anticipate NSVD 7. Dr. Logan Bores notified of admission  [name], [facility] 01/20/2023 10:24 AM

## 2023-01-20 NOTE — Progress Notes (Signed)
LABOR NOTE   SUBJECTIVE:   Dawn Richards is a 27 y.o.  G2P1001  at [redacted]w[redacted]d whose labor is being augmented for prolonged latent phase. She has progressed well on Pitocin and is now completely dilated. She is comfortable with her epidural.  Analgesia: Epidural  OBJECTIVE:  BP (!) 109/55   Pulse 76   Temp 98.1 F (36.7 C) (Oral)   Resp 18   Ht 5' (1.524 m)   Wt 100.7 kg   LMP 04/12/2022 (Exact Date)   SpO2 99%   BMI 43.36 kg/m  No intake/output data recorded.  SVE:   Dilation: 10 Effacement (%): 100 Station: Plus 1 Exam by:: Quitman Livings, CNM CONTRACTIONS: regular, every 2-3 minutes FHR: Fetal heart tracing reviewed. Baseline: 135 Variability: moderate Accelerations: present Decelerations:variable Category 2  Labs: Lab Results  Component Value Date   WBC 10.5 01/20/2023   HGB 10.3 (L) 01/20/2023   HCT 31.2 (L) 01/20/2023   MCV 86.7 01/20/2023   PLT 250 01/20/2023    ASSESSMENT: - Augmentation of labor, completely dilated -Coping well -Membranes: ruptured, light meconium     Active Problems:   Labor and delivery, indication for care   BMI 40.0-44.9, adult (HCC)   PLAN: -Dawn Richards prefers to begin pushing once partner arrives -Anticipate NSVD   Dawn Richards, CNM 01/20/2023 5:47 PM

## 2023-01-21 LAB — RPR: RPR Ser Ql: NONREACTIVE

## 2023-01-21 NOTE — Anesthesia Postprocedure Evaluation (Signed)
Anesthesia Post Note  Patient: Dawn Richards  Procedure(s) Performed: AN AD HOC LABOR EPIDURAL  Patient location during evaluation: Mother Baby Anesthesia Type: Epidural Level of consciousness: oriented and awake and alert Pain management: pain level controlled Vital Signs Assessment: post-procedure vital signs reviewed and stable Respiratory status: spontaneous breathing and respiratory function stable Cardiovascular status: blood pressure returned to baseline and stable Postop Assessment: no headache, no backache, no apparent nausea or vomiting and able to ambulate Anesthetic complications: no  No notable events documented.   Last Vitals:  Vitals:   01/20/23 2300 01/21/23 0308  BP: (!) 110/59 (!) 93/54  Pulse: 88 72  Resp: 16 18  Temp: 37.4 C 36.7 C  SpO2: 99% 100%    Last Pain:  Vitals:   01/21/23 0308  TempSrc: Oral  PainSc:                  Starling Manns

## 2023-01-21 NOTE — Progress Notes (Signed)
Post Partum Day 1 Subjective: no complaints, up ad lib, voiding, tolerating PO, and she is resting sleepily at the moment. She verbalizes that she plans to both breast and bottle feed, but appears to be formula feeding.  Objective: Blood pressure 110/67, pulse 78, temperature 98.2 F (36.8 C), temperature source Oral, resp. rate 20, height 5' (1.524 m), weight 100.7 kg, last menstrual period 04/12/2022, SpO2 100 %, unknown if currently breastfeeding.  Physical Exam:  General: cooperative, fatigued, no distress, and moderately obese Lochia: appropriate Uterine Fundus: firm Incision: healing well DVT Evaluation: No evidence of DVT seen on physical exam. Negative Homan's sign. No significant calf/ankle edema.  Recent Labs    01/20/23 1337  HGB 10.3*  HCT 31.2*    Assessment/Plan: Plan for discharge tomorrow, Lactation consult, and Contraception plans IUD   LOS: 1 day   Mirna Mires, CNM 01/21/2023, 10:28 AM

## 2023-01-21 NOTE — Lactation Note (Addendum)
This note was copied from a baby's chart. Lactation Consultation Note  Patient Name: Dawn Richards [id number] Date: 01/21/2023 Age:25 hours      LC in to room baby rooting and ready to feed. Offered mom breastfeeding assistance . Mom declined LC assistance and is bottle feeding baby formula. Per mom she offers the baby to breast for a few feeds and gives the bottle. This is what she did with her first baby for the first few days of the baby's life and then formula fed.  Update provided to care nurse.  Fuller Song 01/21/2023, 6:03 PM

## 2023-01-22 MED ORDER — IBUPROFEN 600 MG PO TABS: 600.0000 mg | ORAL_TABLET | Freq: Four times a day (QID) | ORAL | 0 refills | Status: DC

## 2023-01-22 MED ORDER — FERROUS SULFATE 325 (65 FE) MG PO TABS: 325.0000 mg | ORAL_TABLET | Freq: Two times a day (BID) | ORAL | 3 refills | Status: DC

## 2023-01-22 MED ORDER — WITCH HAZEL-GLYCERIN EX PADS: MEDICATED_PAD | CUTANEOUS | 12 refills | Status: AC | PRN

## 2023-01-22 MED ORDER — PRENATAL MULTIVITAMIN CH: 1.0000 | ORAL_TABLET | Freq: Every day | ORAL | 3 refills | Status: DC

## 2023-01-22 MED ORDER — BENZOCAINE-MENTHOL 20-0.5 % EX AERO: 1.0000 | INHALATION_SPRAY | CUTANEOUS | Status: DC | PRN

## 2023-01-22 MED ORDER — ACETAMINOPHEN 325 MG PO TABS: 650.0000 mg | ORAL_TABLET | ORAL | Status: AC | PRN

## 2023-01-22 MED ORDER — COCONUT OIL OIL: 1.0000 | TOPICAL_OIL | 0 refills | Status: DC | PRN

## 2023-01-22 NOTE — Progress Notes (Signed)
Pt discharged with infant.  Discharge instructions, prescriptions and follow up appointment given to and reviewed with pt. Pt verbalized understanding. Escorted out by auxillary. 

## 2023-02-25 ENCOUNTER — Telehealth: Payer: Self-pay

## 2023-02-25 NOTE — Telephone Encounter (Signed)
[facility]- Discharge Call Backs- Spoke to patient on the phone about the following below. 1-Do you have any questions or concerns about yourself as you heal?Yes -Had pain under her breast area.  Has not had OBGYN visit yet.  Encouraged her to call to set this up as soon as possible to make sure everything was ok. 2-Any concerns or questions about your baby?No 3- Reviewed ABC's of safe sleep. 4-How was your stay at the hospital?Good 5- Did our team work together to care for you?Yes You should be receiving a survey in the mail soon.   We would really appreciate it if you could fill that out for [place] and return it in the mail.  We value the feedback to make improvements and continue the great work we do.   If you have any questions please feel free to call me back at [phone]

## 2023-05-04 ENCOUNTER — Ambulatory Visit: Payer: Medicaid Other | Admitting: Obstetrics

## 2023-06-01 ENCOUNTER — Ambulatory Visit: Payer: Medicaid Other | Admitting: Obstetrics

## 2023-12-06 ENCOUNTER — Emergency Department

## 2023-12-06 ENCOUNTER — Emergency Department
Admission: EM | Admit: 2023-12-06 | Discharge: 2023-12-07 | Disposition: A | Discharge status: Home / Self Care | Attending: Emergency Medicine | Admitting: Emergency Medicine

## 2023-12-06 ENCOUNTER — Encounter: Payer: Self-pay | Admitting: Emergency Medicine

## 2023-12-06 ENCOUNTER — Other Ambulatory Visit: Payer: Self-pay

## 2023-12-06 DIAGNOSIS — M79605 Pain in left leg: Secondary | ICD-10-CM

## 2023-12-06 DIAGNOSIS — M25552 Pain in left hip: Secondary | ICD-10-CM | POA: Diagnosis not present

## 2023-12-06 DIAGNOSIS — M25511 Pain in right shoulder: Secondary | ICD-10-CM | POA: Insufficient documentation

## 2023-12-06 DIAGNOSIS — S301XXA Contusion of abdominal wall, initial encounter: Secondary | ICD-10-CM | POA: Insufficient documentation

## 2023-12-06 DIAGNOSIS — Y92410 Unspecified street and highway as the place of occurrence of the external cause: Secondary | ICD-10-CM | POA: Insufficient documentation

## 2023-12-06 DIAGNOSIS — S3991XA Unspecified injury of abdomen, initial encounter: Secondary | ICD-10-CM | POA: Diagnosis not present

## 2023-12-06 DIAGNOSIS — K760 Fatty (change of) liver, not elsewhere classified: Secondary | ICD-10-CM | POA: Diagnosis not present

## 2023-12-06 DIAGNOSIS — K429 Umbilical hernia without obstruction or gangrene: Secondary | ICD-10-CM | POA: Diagnosis not present

## 2023-12-06 DIAGNOSIS — K8020 Calculus of gallbladder without cholecystitis without obstruction: Secondary | ICD-10-CM | POA: Diagnosis not present

## 2023-12-06 DIAGNOSIS — M70811 Other soft tissue disorders related to use, overuse and pressure, right shoulder: Secondary | ICD-10-CM | POA: Diagnosis not present

## 2023-12-06 DIAGNOSIS — S7012XA Contusion of left thigh, initial encounter: Secondary | ICD-10-CM | POA: Diagnosis not present

## 2023-12-06 LAB — COMPREHENSIVE METABOLIC PANEL WITH GFR
ALT: 28 U/L (ref 0–44)
AST: 23 U/L (ref 15–41)
Albumin: 3.5 g/dL (ref 3.5–5.0)
Alkaline Phosphatase: 47 U/L (ref 38–126)
Anion gap: 6 (ref 5–15)
BUN: 11 mg/dL (ref 6–20)
CO2: 22 mmol/L (ref 22–32)
Calcium: 8.2 mg/dL — ABNORMAL LOW (ref 8.9–10.3)
Chloride: 109 mmol/L (ref 98–111)
Creatinine, Ser: 0.54 mg/dL (ref 0.44–1.00)
GFR, Estimated: >60 mL/min (ref >60)
Glucose, Bld: 110 mg/dL — ABNORMAL HIGH (ref 70–99)
Potassium: 3.9 mmol/L (ref 3.5–5.1)
Sodium: 137 mmol/L (ref 135–145)
Total Bilirubin: 0.5 mg/dL (ref 0.0–1.2)
Total Protein: 6.6 g/dL (ref 6.5–8.1)

## 2023-12-06 LAB — CBC
HCT: 35 % — ABNORMAL LOW (ref 36.0–46.0)
Hemoglobin: 11.5 g/dL — ABNORMAL LOW (ref 12.0–15.0)
MCH: 29 pg (ref 26.0–34.0)
MCHC: 32.9 g/dL (ref 30.0–36.0)
MCV: 88.2 fL (ref 80.0–100.0)
Platelets: 299 10*3/uL (ref 150–400)
RBC: 3.97 MIL/uL (ref 3.87–5.11)
RDW: 12.6 % (ref 11.5–15.5)
WBC: 11.7 10*3/uL — ABNORMAL HIGH (ref 4.0–10.5)
nRBC: 0 % (ref 0.0–0.2)

## 2023-12-06 LAB — POC URINE PREG, ED: Preg Test, Ur: NEGATIVE

## 2023-12-06 MED ORDER — KETOROLAC TROMETHAMINE 30 MG/ML IJ SOLN
30.0000 mg | Freq: Once | INTRAMUSCULAR | Status: AC
  Administered 2023-12-06: 30 mg via INTRAVENOUS

## 2023-12-06 MED ORDER — IOHEXOL 300 MG/ML  SOLN
100.0000 mL | Freq: Once | INTRAMUSCULAR | Status: AC | PRN
  Administered 2023-12-06: 100 mL via INTRAVENOUS

## 2023-12-06 MED ORDER — KETOROLAC TROMETHAMINE 30 MG/ML IJ SOLN
30.0000 mg | Freq: Once | INTRAMUSCULAR | Status: DC
  Filled 2023-12-06: qty 1
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
## 2023-12-06 NOTE — ED Notes (Signed)
 Phyllis Breeze, [place] notified of the patients request for pain meds at this time.

## 2023-12-06 NOTE — ED Notes (Signed)
 Pt was able to ambulate to the bathroom and back. It was very painful for pt to get up and walk. Pt unable to fully use left leg.

## 2023-12-06 NOTE — ED Notes (Signed)
 Pt to CT
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
## 2024-05-24 ENCOUNTER — Other Ambulatory Visit: Payer: Self-pay

## 2024-05-24 ENCOUNTER — Emergency Department

## 2024-05-24 ENCOUNTER — Emergency Department
Admission: EM | Admit: 2024-05-24 | Discharge: 2024-05-24 | Disposition: A | Discharge status: Home / Self Care | Attending: Emergency Medicine | Admitting: Emergency Medicine

## 2024-05-24 DIAGNOSIS — Z3491 Encounter for supervision of normal pregnancy, unspecified, first trimester: Secondary | ICD-10-CM | POA: Insufficient documentation

## 2024-05-24 DIAGNOSIS — Z3A10 10 weeks gestation of pregnancy: Secondary | ICD-10-CM | POA: Diagnosis not present

## 2024-05-24 DIAGNOSIS — R102 Pelvic and perineal pain unspecified side: Secondary | ICD-10-CM | POA: Diagnosis not present

## 2024-05-24 DIAGNOSIS — O26891 Other specified pregnancy related conditions, first trimester: Secondary | ICD-10-CM | POA: Diagnosis not present

## 2024-05-24 DIAGNOSIS — Z3A01 Less than 8 weeks gestation of pregnancy: Secondary | ICD-10-CM | POA: Diagnosis not present

## 2024-05-24 DIAGNOSIS — R1030 Lower abdominal pain, unspecified: Secondary | ICD-10-CM | POA: Diagnosis not present

## 2024-05-24 LAB — URINALYSIS, ROUTINE W REFLEX MICROSCOPIC
Bacteria, UA: NONE SEEN
Bilirubin Urine: NEGATIVE
Glucose, UA: 150 mg/dL — AB
Ketones, ur: NEGATIVE mg/dL
Leukocytes,Ua: NEGATIVE
Nitrite: NEGATIVE
Protein, ur: 30 mg/dL — AB
Specific Gravity, Urine: 1.027 (ref 1.005–1.030)
pH: 5 (ref 5.0–8.0)

## 2024-05-24 LAB — HCG, QUANTITATIVE, PREGNANCY: hCG, Beta Chain, Quant, S: 53778 m[IU]/mL — ABNORMAL HIGH (ref <5)

## 2024-05-24 LAB — CBC WITH DIFFERENTIAL/PLATELET
Abs Immature Granulocytes: 0.02 K/uL (ref 0.00–0.07)
Basophils Absolute: 0 K/uL (ref 0.0–0.1)
Basophils Relative: 0 %
Eosinophils Absolute: 0.1 K/uL (ref 0.0–0.5)
Eosinophils Relative: 2 %
HCT: 34 % — ABNORMAL LOW (ref 36.0–46.0)
Hemoglobin: 11.5 g/dL — ABNORMAL LOW (ref 12.0–15.0)
Immature Granulocytes: 0 %
Lymphocytes Relative: 40 %
Lymphs Abs: 2.9 K/uL (ref 0.7–4.0)
MCH: 28.4 pg (ref 26.0–34.0)
MCHC: 33.8 g/dL (ref 30.0–36.0)
MCV: 84 fL (ref 80.0–100.0)
Monocytes Absolute: 0.3 K/uL (ref 0.1–1.0)
Monocytes Relative: 4 %
Neutro Abs: 3.9 K/uL (ref 1.7–7.7)
Neutrophils Relative %: 54 %
Platelets: 256 K/uL (ref 150–400)
RBC: 4.05 MIL/uL (ref 3.87–5.11)
RDW: 12.3 % (ref 11.5–15.5)
WBC: 7.2 K/uL (ref 4.0–10.5)
nRBC: 0 % (ref 0.0–0.2)

## 2024-05-24 LAB — POC URINE PREG, ED: Preg Test, Ur: POSITIVE — AB

## 2024-05-24 LAB — ABO/RH: ABO/RH(D): A POS

## 2024-05-24 MED ORDER — ONDANSETRON 4 MG PO TBDP: 4.0000 mg | ORAL_TABLET | Freq: Three times a day (TID) | ORAL | 0 refills | Status: DC | PRN

## 2024-05-24 MED ORDER — PRENATAL VITAMINS 28-0.8 MG PO TABS: 1.0000 | ORAL_TABLET | Freq: Every day | ORAL | 11 refills | Status: AC

## 2024-05-24 MED ORDER — ACETAMINOPHEN 325 MG PO TABS
650.0000 mg | ORAL_TABLET | Freq: Once | ORAL | Status: AC
  Administered 2024-05-24: 650 mg via ORAL
  Filled 2024-05-24: qty 2

## 2024-05-24 MED ORDER — ONDANSETRON 4 MG PO TBDP
4.0000 mg | ORAL_TABLET | Freq: Once | ORAL | Status: AC
  Administered 2024-05-24: 4 mg via ORAL
  Filled 2024-05-24: qty 1

## 2024-05-24 NOTE — ED Triage Notes (Signed)
 Pt to ED via POV from home. Pt reports found out last week she was pregnant. Pt has been reporting abd cramping. Denies vaginal bleeding. Pt told by OBGYN to come to be evaluated for miscarriage. Pt is G4P2.

## 2024-05-24 NOTE — ED Provider Notes (Signed)
 [facility] Emergency Department Provider Note     Event Date/Time   First MD Initiated Contact with Patient 05/24/24 2010     (approximate)   History   Abdominal Cramping   HPI  Dawn Richards is a 28 y.o. female G4P2 presents to the ED for evaluation of abdominal cramping and pelvic pain.  Patient denies any frank vaginal bleeding.  She found out last week that she was pregnant.  She reports her LMP at 8//25, putting her at approximately [redacted] weeks gestational age.  Chart review reveals the patient had a medication-assisted abortion in May.  Physical Exam   Triage Vital Signs: ED Triage Vitals  Encounter Vitals Group     BP 05/24/24 1815 (!) 142/98     Girls Systolic BP Percentile --      Girls Diastolic BP Percentile --      Boys Systolic BP Percentile --      Boys Diastolic BP Percentile --      Pulse Rate 05/24/24 1815 100     Resp 05/24/24 1815 18     Temp 05/24/24 1815 98.2 F (36.8 C)     Temp src --      SpO2 05/24/24 1815 98 %     Weight --      Height --      Head Circumference --      Peak Flow --      Pain Score 05/24/24 1816 7     Pain Loc --      Pain Education --      Exclude from Growth Chart --     Most recent vital signs: Vitals:   05/24/24 1815  BP: (!) 142/98  Pulse: 100  Resp: 18  Temp: 98.2 F (36.8 C)  SpO2: 98%    General Awake, no distress. NAD HEENT NCAT. PERRL. EOMI. No rhinorrhea. Mucous membranes are moist.  CV:  Good peripheral perfusion. RRR RESP:  Normal effort. CTA ABD:  No distention.  GU:  deferred   ED Results / Procedures / Treatments   Labs (all labs ordered are listed, but only abnormal results are displayed) Labs Reviewed  CBC WITH DIFFERENTIAL/PLATELET - Abnormal; Notable for the following components:      Result Value   Hemoglobin 11.5 (*)    HCT 34.0 (*)    All other components within normal limits  URINALYSIS, ROUTINE W REFLEX MICROSCOPIC - Abnormal; Notable  for the following components:   Color, Urine YELLOW (*)    APPearance HAZY (*)    Glucose, UA 150 (*)    Hgb urine dipstick MODERATE (*)    Protein, ur 30 (*)    All other components within normal limits  HCG, QUANTITATIVE, PREGNANCY - Abnormal; Notable for the following components:   hCG, Beta Chain, Quant, S 53,778 (*)    All other components within normal limits  POC URINE PREG, ED - Abnormal; Notable for the following components:   Preg Test, Ur POSITIVE (*)    All other components within normal limits  ABO/RH     EKG   RADIOLOGY  I personally viewed and evaluated these images as part of my medical decision making, as well as reviewing the written report by the radiologist.  ED Provider Interpretation: Single IUP at 10 weeks 3 days  US  OB Comp Less 14 Wks Result Date: 05/24/2024 EXAM: OBSTETRIC ULTRASOUND FIRST TRIMESTER TECHNIQUE: Transvaginal first trimester obstetric pelvic duplex ultrasound was performed with real-time  imaging, color flow Doppler imaging, and spectral analysis. COMPARISON: None provided. CLINICAL HISTORY: Pelvic pain. FINDINGS: UTERUS: No focal myometrial mass. GESTATIONAL SAC(S): Single normal appearing intrauterine gestational sac. No subchorionic hemorrhage. YOLK SAC: Present EMBRYO(<11WK) /FETUS(>=11WK): Single CROWN RUMP LENGTH: 36 mm corresponding to gestational age of [redacted] weeks and 3 days. RATE OF CARDIAC ACTIVITY: 157 beats per minute. RIGHT OVARY: Unremarkable. Normal arterial and venous flow. LEFT OVARY: Unremarkable. Normal arterial and venous flow. FREE FLUID: No free fluid. MEASUREMENTS ESTIMATED GESTATIONAL AGE BY CURRENT ULTRASOUND: 10 weeks and 3 days. ESTIMATED GESTATIONAL AGE BY LMP/PRIOR ULTRASOUND: Not provided. ESTIMATED DUE DATE: 12/17/2024. IMPRESSION: 1. Single viable intrauterine pregnancy. 2. Estimated gestational age [redacted] weeks 3 days by crownrump length (36 mm). Estimated due date 12/17/2024. Electronically signed by: Greig Pique MD  05/24/2024 10:32 PM EDT RP Workstation: [id number]     PROCEDURES:  Critical Care performed: No  Procedures   MEDICATIONS ORDERED IN ED: Medications  acetaminophen  (TYLENOL ) tablet 650 mg (650 mg Oral Given 05/24/24 2240)  ondansetron  (ZOFRAN -ODT) disintegrating tablet 4 mg (4 mg Oral Given 05/24/24 2241)     IMPRESSION / MDM / ASSESSMENT AND PLAN / ED COURSE  I reviewed the triage vital signs and the nursing notes.                              Differential diagnosis includes, but is not limited to, threatened miscarriage, incomplete miscarriage, normal bleeding from an early trimester pregnancy, ectopic pregnancy, , blighted ovum, vaginal/cervical trauma, subchorionic hemorrhage/hematoma, etc.  Patient's presentation is most consistent with acute complicated illness / injury requiring diagnostic workup.  Patient's diagnosis is consistent with first trimester single IUP confirmed on ultrasound.  Patient presents with history of irregular menses and recent positive home pregnancy test.  She endorses some mild pelvic cramping but denies any frank vaginal bleeding.  Patient was advised to report to the ED for ultrasound imaging for dating and to rule out ectopic.  Patient is reassured by her normal exam and workup, and essentially normal-appearing pregnancy.  Patient will be discharged home with prescriptions for Zofran  and prenatal vitamins. Patient is to follow up with [facility] as planned, as needed or otherwise directed. Patient is given ED precautions to return to the ED for any worsening or new symptoms.  FINAL CLINICAL IMPRESSION(S) / ED DIAGNOSES   Final diagnoses:  Normal intrauterine pregnancy on prenatal ultrasound in first trimester     Rx / DC Orders   ED Discharge Orders          Ordered    ondansetron  (ZOFRAN -ODT) 4 MG disintegrating tablet  Every 8 hours PRN        05/24/24 2239    Prenatal Vit-Fe Fumarate-FA (PRENATAL VITAMINS) 28-0.8 MG TABS  Daily         05/24/24 2239             Note:  This document was prepared using Dragon voice recognition software and may include unintentional dictation errors.    Loyd Candida LULLA Aldona, PA-C 05/24/24 2241    Dorothyann Drivers, MD 05/24/24 2332

## 2024-05-24 NOTE — Discharge Instructions (Addendum)
 Follow-up with [facility] for routine prenatal care.

## 2024-06-16 ENCOUNTER — Emergency Department

## 2024-06-16 ENCOUNTER — Observation Stay
Admission: EM | Admit: 2024-06-16 | Discharge: 2024-06-17 | Disposition: A | Discharge status: Home / Self Care | Attending: Emergency Medicine | Admitting: Emergency Medicine

## 2024-06-16 ENCOUNTER — Other Ambulatory Visit: Payer: Self-pay

## 2024-06-16 DIAGNOSIS — O039 Complete or unspecified spontaneous abortion without complication: Secondary | ICD-10-CM | POA: Diagnosis not present

## 2024-06-16 DIAGNOSIS — Z87891 Personal history of nicotine dependence: Secondary | ICD-10-CM | POA: Diagnosis not present

## 2024-06-16 DIAGNOSIS — Z3A00 Weeks of gestation of pregnancy not specified: Secondary | ICD-10-CM | POA: Diagnosis not present

## 2024-06-16 DIAGNOSIS — O074 Failed attempted termination of pregnancy without complication: Secondary | ICD-10-CM | POA: Diagnosis present

## 2024-06-16 DIAGNOSIS — O034 Incomplete spontaneous abortion without complication: Principal | ICD-10-CM | POA: Insufficient documentation

## 2024-06-16 DIAGNOSIS — R9389 Abnormal findings on diagnostic imaging of other specified body structures: Secondary | ICD-10-CM | POA: Diagnosis not present

## 2024-06-16 DIAGNOSIS — O046 Delayed or excessive hemorrhage following (induced) termination of pregnancy: Secondary | ICD-10-CM | POA: Diagnosis not present

## 2024-06-16 DIAGNOSIS — N854 Malposition of uterus: Secondary | ICD-10-CM | POA: Diagnosis not present

## 2024-06-16 LAB — CBC WITH DIFFERENTIAL/PLATELET
Abs Immature Granulocytes: 0.03 K/uL (ref 0.00–0.07)
Basophils Absolute: 0 K/uL (ref 0.0–0.1)
Basophils Relative: 0 %
Eosinophils Absolute: 0.1 K/uL (ref 0.0–0.5)
Eosinophils Relative: 2 %
HCT: 26.8 % — ABNORMAL LOW (ref 36.0–46.0)
Hemoglobin: 8.9 g/dL — ABNORMAL LOW (ref 12.0–15.0)
Immature Granulocytes: 0 %
Lymphocytes Relative: 35 %
Lymphs Abs: 3.1 K/uL (ref 0.7–4.0)
MCH: 28.3 pg (ref 26.0–34.0)
MCHC: 33.2 g/dL (ref 30.0–36.0)
MCV: 85.4 fL (ref 80.0–100.0)
Monocytes Absolute: 0.4 K/uL (ref 0.1–1.0)
Monocytes Relative: 5 %
Neutro Abs: 5.1 K/uL (ref 1.7–7.7)
Neutrophils Relative %: 58 %
Platelets: 247 K/uL (ref 150–400)
RBC: 3.14 MIL/uL — ABNORMAL LOW (ref 3.87–5.11)
RDW: 12.7 % (ref 11.5–15.5)
WBC: 8.9 K/uL (ref 4.0–10.5)
nRBC: 0 % (ref 0.0–0.2)

## 2024-06-16 LAB — HCG, QUANTITATIVE, PREGNANCY: hCG, Beta Chain, Quant, S: 14347 m[IU]/mL — ABNORMAL HIGH (ref <5)

## 2024-06-16 MED ORDER — MORPHINE SULFATE (PF) 2 MG/ML IV SOLN
2.0000 mg | INTRAVENOUS | Status: DC | PRN
  Administered 2024-06-17: 2 mg via INTRAVENOUS
  Filled 2024-06-16: qty 1

## 2024-06-16 MED ORDER — LACTATED RINGERS IV SOLN: INTRAVENOUS | Status: DC

## 2024-06-16 MED ORDER — IBUPROFEN 800 MG PO TABS
800.0000 mg | ORAL_TABLET | Freq: Once | ORAL | Status: AC
  Administered 2024-06-16: 800 mg via ORAL
  Filled 2024-06-16: qty 1

## 2024-06-16 MED ORDER — FENTANYL CITRATE (PF) 50 MCG/ML IJ SOSY
50.0000 ug | PREFILLED_SYRINGE | Freq: Once | INTRAMUSCULAR | Status: AC
  Administered 2024-06-16: 50 ug via INTRAVENOUS
  Filled 2024-06-16: qty 1

## 2024-06-16 MED ORDER — IBUPROFEN 600 MG PO TABS
600.0000 mg | ORAL_TABLET | Freq: Four times a day (QID) | ORAL | Status: DC | PRN
  Administered 2024-06-17: 600 mg via ORAL
  Filled 2024-06-16: qty 1

## 2024-06-16 MED ORDER — MISOPROSTOL 200 MCG PO TABS
400.0000 ug | ORAL_TABLET | Freq: Once | ORAL | Status: AC
  Administered 2024-06-16: 400 ug via VAGINAL
  Filled 2024-06-16: qty 2

## 2024-06-16 NOTE — ED Triage Notes (Signed)
 Patient states she is approximately [redacted] weeks pregnant and started having vaginal bleeding yesterday; called OB today and was told to come to ED.

## 2024-06-16 NOTE — H&P (Addendum)
 Dawn Richards is an 28 y.o. female. She presented to the ED with complaints of vaginal bleeding after taking medication at home to induce abortion of unwanted pregnancy. She noted feeling like something was stuck.   Pertinent Gynecological History: Menses: flow is moderate Bleeding: post termination  Contraception: none Blood transfusions: none Sexually transmitted diseases: HSV Previous GYN Procedures: none  Last pap: normal Date: 05/14/2022 OB History: G3, P2012   Menstrual History: Patient's last menstrual period was 03/14/2024 (approximate).    Past Medical History:  Diagnosis Date   DUB (dysfunctional uterine bleeding)    Pelvic pain     Past Surgical History:  Procedure Laterality Date   WISDOM TOOTH EXTRACTION     [age]; age [age]    Family History  Problem Relation Age of Onset   Diabetes Mother    Kidney failure Father    Cancer Paternal Grandmother        breast   Breast cancer Neg Hx    Ovarian cancer Neg Hx    Colon cancer Neg Hx    Heart disease Neg Hx     Social History:  reports that she quit smoking about 2 years ago. Her smoking use included cigarettes. She has never used smokeless tobacco. She reports that she does not currently use alcohol. She reports that she does not currently use drugs after having used the following drugs: Marijuana.  Allergies: No Known Allergies  Medications: I have reviewed the patient's current medications.  Review of Systems  Constitutional: Negative.   HENT: Negative.    Eyes: Negative.   Respiratory: Negative.    Cardiovascular: tachycardia    Gastrointestinal: Negative.   Genitourinary:        Heavy vaginal bleeding throughout the day. Passing of fetal tissue .   Musculoskeletal:        Abdominal pain and cramping  Skin: Negative.   Neurological: Negative.   Endo/Heme/Allergies: Negative.   Psychiatric/Behavioral: Negative.       Blood pressure 128/74, pulse (!) 112, temperature 97.8  F (36.6 C), temperature source Oral, resp. rate 18, height 5' (1.524 m), weight 97.5 kg, last menstrual period 03/14/2024, SpO2 100%, unknown if currently breastfeeding.  Physical Exam Constitutional:      Appearance: Normal appearance. She is obese.  HENT:     Head: Normocephalic and atraumatic.     Mouth/Throat:     Mouth: Mucous membranes are moist.  Cardiovascular:     Rate and Rhythm: Normal rate and regular rhythm.  Pulmonary:     Effort: Pulmonary effort is normal.     Breath sounds: Normal breath sounds.  Abdominal:     General: Abdomen is flat.     Palpations: Abdomen is soft.     Tenderness: There is abdominal tenderness.  Genitourinary:    Comments: Bright red small vaginal bleeding Musculoskeletal:        General: Normal range of motion.     Cervical back: Normal range of motion and neck supple.  Skin:    General: Skin is warm and dry.  Neurological:     Mental Status: She is alert and oriented to person, place, and time.  Psychiatric:        Mood and Affect: Mood normal.        Behavior: Behavior normal.     Results for orders placed or performed during the hospital encounter of 06/16/24 (from the past 48 hours)  CBC with Differential/Platelet     Status: Abnormal  Collection Time: 06/16/24  5:35 PM  Result Value Ref Range   WBC 8.9 4.0 - 10.5 K/uL   RBC 3.14 (L) 3.87 - 5.11 MIL/uL   Hemoglobin 8.9 (L) 12.0 - 15.0 g/dL   HCT 73.1 (L) 63.9 - 53.9 %   MCV 85.4 80.0 - 100.0 fL   MCH 28.3 26.0 - 34.0 pg   MCHC 33.2 30.0 - 36.0 g/dL   RDW 87.2 88.4 - 84.4 %   Platelets 247 150 - 400 K/uL   nRBC 0.0 0.0 - 0.2 %   Neutrophils Relative % 58 %   Neutro Abs 5.1 1.7 - 7.7 K/uL   Lymphocytes Relative 35 %   Lymphs Abs 3.1 0.7 - 4.0 K/uL   Monocytes Relative 5 %   Monocytes Absolute 0.4 0.1 - 1.0 K/uL   Eosinophils Relative 2 %   Eosinophils Absolute 0.1 0.0 - 0.5 K/uL   Basophils Relative 0 %   Basophils Absolute 0.0 0.0 - 0.1 K/uL   Immature Granulocytes 0  %   Abs Immature Granulocytes 0.03 0.00 - 0.07 K/uL    Comment: Performed at [facility], [address] [place], [place] [address]  hCG, quantitative, pregnancy     Status: Abnormal   Collection Time: 06/16/24  5:35 PM  Result Value Ref Range   hCG, Beta Chain, Quant, S 14,347 (H) <5 mIU/mL    Comment:          GEST. AGE      CONC.  (mIU/mL)   <=1 WEEK        5 - 50     2 WEEKS       50 - 500     3 WEEKS       100 - 10,000     4 WEEKS     1,000 - 30,000     5 WEEKS     3,500 - 115,000   6-8 WEEKS     12,000 - 270,000    12 WEEKS     15,000 - 220,000        FEMALE AND NON-PREGNANT FEMALE:     LESS THAN 5 mIU/mL Performed at [facility], [address], [place], [place] [address]    CLINICAL DATA:  Miscarriage, beta HCG 14,347, concern for retained products of conception   EXAM: OBSTETRIC <14 WK US  AND TRANSVAGINAL OB US    TECHNIQUE: Both transabdominal and transvaginal ultrasound examinations were performed for complete evaluation of the gestation as well as the maternal uterus, adnexal regions, and pelvic cul-de-sac. Transvaginal technique was performed to assess early pregnancy.   COMPARISON:  05/24/2024   FINDINGS: Intrauterine gestational sac: None   Yolk sac:  Not Visualized.   Embryo:  Not Visualized.   Cardiac Activity: Not Visualized.   Maternal uterus/adnexae: The uterus is anteverted. There is heterogeneous thickening of the endometrium measuring up to 4.2 cm in thickness, with increased vascularity on Doppler imaging consistent with retained products of conception.   No free fluid within the pelvis. Right ovary measures 2.9 x 1.7 x 2.0 cm and the left ovary measures 3.0 x 2.0 x 2.3 cm. No adnexal masses.   IMPRESSION: 1. Heterogeneous hypervascular endometrial thickening measuring up to 4.2 cm, consistent with retained products of conception.     Electronically Signed   By: Ozell Daring M.D.   On: 06/16/2024 23:29     Assessment/Plan: Retained productions of conception after at home termination of pregnancy. Bleeding is stable. Overnight observation  with continued monitoring of bleeding and passing of tissue. Possible surgical management should patient not pass retained products. Dr. Leonce notified of ultrasound results and plan and he is in agreement.   Dawn Richards [facility] 06/16/2024

## 2024-06-16 NOTE — ED Notes (Signed)
Med requested from pharmacy.

## 2024-06-16 NOTE — ED Notes (Signed)
 See triage note   Presents with some vaginal bleeding   States she is approx [redacted] weeks pregnant Sl abd cramping

## 2024-06-16 NOTE — ED Notes (Signed)
 Medication inserted vaginally by Zelda Hummer, OB

## 2024-06-16 NOTE — Consult Note (Addendum)
 Reason for Consult:retained products  Referring Physician: Tinnie Hudson PA-C  Dedra BRAVO [name] is an 28 y.o. female. She presented to the ED with complaints of vaginal bleeding after taking medication at home to induce abortion of unwanted pregnancy. She noted feeling like something was stuck.   Pertinent Gynecological History: Menses: flow is moderate Bleeding: post termination  Contraception: none Blood transfusions: none Sexually transmitted diseases: HSV Previous GYN Procedures: none  Last pap: normal Date: 05/14/2022 OB History: G3, P2012   Menstrual History: Patient's last menstrual period was 03/14/2024 (approximate).    Past Medical History:  Diagnosis Date   DUB (dysfunctional uterine bleeding)    Pelvic pain     Past Surgical History:  Procedure Laterality Date   WISDOM TOOTH EXTRACTION     [age]; age [age]    Family History  Problem Relation Age of Onset   Diabetes Mother    Kidney failure Father    Cancer Paternal Grandmother        breast   Breast cancer Neg Hx    Ovarian cancer Neg Hx    Colon cancer Neg Hx    Heart disease Neg Hx     Social History:  reports that she quit smoking about 2 years ago. Her smoking use included cigarettes. She has never used smokeless tobacco. She reports that she does not currently use alcohol. She reports that she does not currently use drugs after having used the following drugs: Marijuana.  Allergies: No Known Allergies  Medications: I have reviewed the patient's current medications.  Review of Systems  Constitutional: Negative.   HENT: Negative.    Eyes: Negative.   Respiratory: Negative.    Cardiovascular: Negative.   Gastrointestinal: Negative.   Genitourinary:        Heavy vaginal bleeding throughout the day. Passing of fetal tissue .   Musculoskeletal:        Abdominal pain and cramping  Skin: Negative.   Neurological: Negative.   Endo/Heme/Allergies: Negative.    Psychiatric/Behavioral: Negative.       Blood pressure 128/74, pulse (!) 112, temperature 97.8 F (36.6 C), temperature source Oral, resp. rate 18, height 5' (1.524 m), weight 97.5 kg, last menstrual period 03/14/2024, SpO2 100%, unknown if currently breastfeeding.  Physical Exam Constitutional:      Appearance: Normal appearance. She is obese.  HENT:     Head: Normocephalic and atraumatic.     Mouth/Throat:     Mouth: Mucous membranes are moist.  Cardiovascular:     Rate and Rhythm: Normal rate and regular rhythm.  Pulmonary:     Effort: Pulmonary effort is normal.     Breath sounds: Normal breath sounds.  Abdominal:     General: Abdomen is flat.     Palpations: Abdomen is soft.     Tenderness: There is abdominal tenderness.  Genitourinary:    Comments: Bright red vaginal bleeding Musculoskeletal:        General: Normal range of motion.     Cervical back: Normal range of motion and neck supple.  Skin:    General: Skin is warm and dry.  Neurological:     Mental Status: She is alert and oriented to person, place, and time.  Psychiatric:        Mood and Affect: Mood normal.        Behavior: Behavior normal.     Results for orders placed or performed during the hospital encounter of 06/16/24 (from the past 48 hours)  CBC with Differential/Platelet  Status: Abnormal   Collection Time: 06/16/24  5:35 PM  Result Value Ref Range   WBC 8.9 4.0 - 10.5 K/uL   RBC 3.14 (L) 3.87 - 5.11 MIL/uL   Hemoglobin 8.9 (L) 12.0 - 15.0 g/dL   HCT 73.1 (L) 63.9 - 53.9 %   MCV 85.4 80.0 - 100.0 fL   MCH 28.3 26.0 - 34.0 pg   MCHC 33.2 30.0 - 36.0 g/dL   RDW 87.2 88.4 - 84.4 %   Platelets 247 150 - 400 K/uL   nRBC 0.0 0.0 - 0.2 %   Neutrophils Relative % 58 %   Neutro Abs 5.1 1.7 - 7.7 K/uL   Lymphocytes Relative 35 %   Lymphs Abs 3.1 0.7 - 4.0 K/uL   Monocytes Relative 5 %   Monocytes Absolute 0.4 0.1 - 1.0 K/uL   Eosinophils Relative 2 %   Eosinophils Absolute 0.1 0.0 - 0.5 K/uL    Basophils Relative 0 %   Basophils Absolute 0.0 0.0 - 0.1 K/uL   Immature Granulocytes 0 %   Abs Immature Granulocytes 0.03 0.00 - 0.07 K/uL    Comment: Performed at [facility], [address] [place], [place] [address]  hCG, quantitative, pregnancy     Status: Abnormal   Collection Time: 06/16/24  5:35 PM  Result Value Ref Range   hCG, Beta Chain, Quant, S 14,347 (H) <5 mIU/mL    Comment:          GEST. AGE      CONC.  (mIU/mL)   <=1 WEEK        5 - 50     2 WEEKS       50 - 500     3 WEEKS       100 - 10,000     4 WEEKS     1,000 - 30,000     5 WEEKS     3,500 - 115,000   6-8 WEEKS     12,000 - 270,000    12 WEEKS     15,000 - 220,000        FEMALE AND NON-PREGNANT FEMALE:     LESS THAN 5 mIU/mL Performed at [facility], [address] [place], [place] [address]     No results found.  Assessment/Plan: Discussed recommended treatment plan with patient. Reviewed use of speculum and removal of tissue. She is in agreement to plan. IV pain medication given during tissue removal. Sterile speculum placed in vagina , retained tissue visualized extruding from cervical os. Ring forceps was used to remove visualized tissue.   On digital exam retained tissue felt in cervix . Attempted to remove with ring forceps , which was not tolerated well by patient. Unable to reach tissue in os with forceps. 400 mcg cytotec  ordered and placed vaginally.  Recommend transvaginal ultrasound and continued monitoring of bleeding. If no retained products seen on u/s and vaginal bleeding minimal may discharge home tonight with follow up in office Monday. If U/s shows retained products suspected will follow up with Dr. Leonce for possible surgical management. I have reviewed plan of care with Dr. Leonce and he is in agreement.   Zelda Hummer [facility] 06/16/2024

## 2024-06-16 NOTE — ED Provider Notes (Signed)
 [facility] Provider Note    Event Date/Time   First MD Initiated Contact with Patient 06/16/24 1841     (approximate)   History   Vaginal Bleeding   HPI  Dawn Richards Theophilus is a 28 y.o. female G4 P2-0-1-2 who is about [redacted] weeks pregnant presents for evaluation of vaginal bleeding that began yesterday.  Patient states that this was an undesired pregnancy so on Monday she took medication to induce an abortion.  She took mifepristone Monday night and misoprostol  Tuesday night and began bleeding Wednesday morning.  Patient presents to the ED today because she has been bleeding significantly and feels like there is something stuck in her vagina.  She reached out to her OB who encouraged her to come to the emergency department.     Physical Exam   Triage Vital Signs: ED Triage Vitals  Encounter Vitals Group     BP 06/16/24 1734 128/74     Girls Systolic BP Percentile --      Girls Diastolic BP Percentile --      Boys Systolic BP Percentile --      Boys Diastolic BP Percentile --      Pulse Rate 06/16/24 1734 (!) 112     Resp 06/16/24 1734 18     Temp 06/16/24 1734 97.8 F (36.6 C)     Temp Source 06/16/24 1734 Oral     SpO2 06/16/24 1734 100 %     Weight 06/16/24 1732 215 lb (97.5 kg)     Height 06/16/24 1732 5' (1.524 m)     Head Circumference --      Peak Flow --      Pain Score 06/16/24 1731 9     Pain Loc --      Pain Education --      Exclude from Growth Chart --     Most recent vital signs: Vitals:   06/16/24 1734  BP: 128/74  Pulse: (!) 112  Resp: 18  Temp: 97.8 F (36.6 C)  SpO2: 100%   General: Awake, tearful. CV:  Good peripheral perfusion.  Resp:  Normal effort.  Abd:  No distention.  Pelvic:  No external vaginal lesions, blood in vaginal vault with products of conception in the cervical os   ED Results / Procedures / Treatments   Labs (all labs ordered are listed, but only abnormal results are  displayed) Labs Reviewed  CBC WITH DIFFERENTIAL/PLATELET - Abnormal; Notable for the following components:      Result Value   RBC 3.14 (*)    Hemoglobin 8.9 (*)    HCT 26.8 (*)    All other components within normal limits  HCG, QUANTITATIVE, PREGNANCY - Abnormal; Notable for the following components:   hCG, Beta Chain, Quant, S 14,347 (*)    All other components within normal limits  URINALYSIS, ROUTINE W REFLEX MICROSCOPIC     RADIOLOGY  Transvaginal ultrasound ordered.  PROCEDURES:  Critical Care performed: No  Procedures   MEDICATIONS ORDERED IN ED: Medications  ibuprofen  (ADVIL ) tablet 800 mg (800 mg Oral Given 06/16/24 2039)  fentaNYL  (SUBLIMAZE ) injection 50 mcg (50 mcg Intravenous Given 06/16/24 2113)  fentaNYL  (SUBLIMAZE ) injection 50 mcg (50 mcg Intravenous Given 06/16/24 2134)  misoprostol  (CYTOTEC ) tablet 400 mcg (400 mcg Vaginal Given 06/16/24 2204)     IMPRESSION / MDM / ASSESSMENT AND PLAN / ED COURSE  I reviewed the triage vital signs and the nursing notes.  28 year old female presents for evaluation of vaginal bleeding. Patient is tachycardic, VSS otherwise, patient tearful and uncomfortable on exam.  Differential diagnosis includes, but is not limited to, incomplete miscarriage, retained products of conception, septic abortion, anemia.  Patient's presentation is most consistent with acute complicated illness / injury requiring diagnostic workup.  CBC shows anemia with hemoglobin of 8.9, reviewed previous labs and hemoglobin was 11.5.  Pelvic exam patient appears to have products of conception within the cervix.  I was able to remove some of these using ring forceps.  Before proceeding with further removal of products will consult OB.  Clinical Course as of 06/16/24 2339  Thu Jun 16, 2024  2025 Spoke with on call midwife who is going to come in and evaluate the patient.  [LD]  2247 OB provider came to evaluate the patient.   She was able to remove more products of conception, but on bimanual exam was still able to feel products within the cervix.  Patient will be given a another dose of Cytotec  to try to facilitate further expulsion.  Will also obtain a transvaginal ultrasound to assess for retained products in the uterus. [LD]  2248 Patient was in significant pain during the removal of the products of conception so she was given IV fentanyl  for pain relief which she responded well to. [LD]  2303 Reviewed consult note from OB, if no retained products on OB ultrasound and bleeding is controlled, patient can be discharged home with outpatient follow up on Monday. If ultrasound shows retained products, will need to reach out to Dr. Leonce as patient will likely need surgery. [LD]  2335 US  OB LESS THAN 14 WEEKS WITH OB TRANSVAGINAL Consistent with retained products of conception. [LD]  2335 Care of the patient will be passed onto the oncoming doc, who will reach out to the OBGYN as patient will likely need surgery. [LD]  2336 Patient updated on care plan, she is in agreement, pain under control. [LD]    Clinical Course User Index [LD] Cleaster Tinnie LABOR, PA-C     FINAL CLINICAL IMPRESSION(S) / ED DIAGNOSES   Final diagnoses:  Incomplete abortion     Rx / DC Orders   ED Discharge Orders     None        Note:  This document was prepared using Dragon voice recognition software and may include unintentional dictation errors.   Cleaster Tinnie LABOR, PA-C 06/16/24 2336    Ernest Ronal BRAVO, MD 06/20/24 [phone]

## 2024-06-16 NOTE — ED Provider Notes (Signed)
.-----------------------------------------   11:47 PM on 06/16/2024 -----------------------------------------  Blood pressure 128/74, pulse (!) 112, temperature 97.8 F (36.6 C), temperature source Oral, resp. rate 18, height 5' (1.524 m), weight 97.5 kg, last menstrual period 03/14/2024, SpO2 100%, unknown if currently breastfeeding.  Assuming care from [facility]  In short, Dawn Richards is a 28 y.o. female with a chief complaint of Vaginal Bleeding .  Refer to the original H&P for additional details.  The current plan of care is to follow-up ultrasound and get updates from OB.  Ultrasound shows retained products of conception, spoke to [facility] who will admit the patient overnight for observation, given that her bleeding improved, they will not take her to the OR at this time.  She is admitted.    Clinical Course as of 06/16/24 2348  Thu Jun 16, 2024  2025 Spoke with on call midwife who is going to come in and evaluate the patient.  [LD]  2247 OB provider came to evaluate the patient.  She was able to remove more products of conception, but on bimanual exam was still able to feel products within the cervix.  Patient will be given a another dose of Cytotec  to try to facilitate further expulsion.  Will also obtain a transvaginal ultrasound to assess for retained products in the uterus. [LD]  2248 Patient was in significant pain during the removal of the products of conception so she was given IV fentanyl  for pain relief which she responded well to. [LD]  2303 Reviewed consult note from OB, if no retained products on OB ultrasound and bleeding is controlled, patient can be discharged home with outpatient follow up on Monday. If ultrasound shows retained products, will need to reach out to Dr. Leonce as patient will likely need surgery. [LD]  2335 US  OB LESS THAN 14 WEEKS WITH OB TRANSVAGINAL Consistent with retained products of conception. [LD]  2335 Care of the patient will be  passed onto the oncoming doc, who will reach out to the OBGYN as patient will likely need surgery. [LD]  2336 Patient updated on care plan, she is in agreement, pain under control. [LD]    Clinical Course User Index [LD] Cleaster Tinnie DELENA DEVONNA Waymond Lorelle Jerri, MD 06/16/24 2348

## 2024-06-17 ENCOUNTER — Encounter: Payer: Self-pay | Admitting: Certified Nurse Midwife

## 2024-06-17 ENCOUNTER — Observation Stay: Admitting: Certified Registered"

## 2024-06-17 ENCOUNTER — Encounter: Admission: EM | Disposition: A | Discharge status: Home / Self Care | Payer: Self-pay | Source: Home / Self Care

## 2024-06-17 DIAGNOSIS — O034 Incomplete spontaneous abortion without complication: Secondary | ICD-10-CM | POA: Diagnosis not present

## 2024-06-17 DIAGNOSIS — O074 Failed attempted termination of pregnancy without complication: Secondary | ICD-10-CM | POA: Diagnosis not present

## 2024-06-17 HISTORY — PX: DILATION AND EVACUATION: SHX1459

## 2024-06-17 LAB — URINALYSIS, ROUTINE W REFLEX MICROSCOPIC
Bacteria, UA: NONE SEEN
RBC / HPF: >50 RBC/hpf (ref 0–5)
Squamous Epithelial / HPF: 0 /HPF (ref 0–5)
WBC, UA: >50 WBC/hpf (ref 0–5)

## 2024-06-17 LAB — CBC
HCT: 24.2 % — ABNORMAL LOW (ref 36.0–46.0)
Hemoglobin: 8.1 g/dL — ABNORMAL LOW (ref 12.0–15.0)
MCH: 28.4 pg (ref 26.0–34.0)
MCHC: 33.5 g/dL (ref 30.0–36.0)
MCV: 84.9 fL (ref 80.0–100.0)
Platelets: 203 K/uL (ref 150–400)
RBC: 2.85 MIL/uL — ABNORMAL LOW (ref 3.87–5.11)
RDW: 12.7 % (ref 11.5–15.5)
WBC: 8.8 K/uL (ref 4.0–10.5)
nRBC: 0 % (ref 0.0–0.2)

## 2024-06-17 LAB — TYPE AND SCREEN
ABO/RH(D): A POS
Antibody Screen: NEGATIVE

## 2024-06-17 SURGERY — DILATION AND EVACUATION, UTERUS
Anesthesia: General | Site: Uterus

## 2024-06-17 MED ORDER — ONDANSETRON HCL 4 MG/2ML IJ SOLN
INTRAMUSCULAR | Status: DC | PRN
  Administered 2024-06-17: 4 mg via INTRAVENOUS

## 2024-06-17 MED ORDER — ACETAMINOPHEN 10 MG/ML IV SOLN
INTRAVENOUS | Status: DC | PRN
  Administered 2024-06-17: 1000 mg via INTRAVENOUS

## 2024-06-17 MED ORDER — PROPOFOL 10 MG/ML IV BOLUS
INTRAVENOUS | Status: DC | PRN
  Administered 2024-06-17: 50 mg via INTRAVENOUS
  Administered 2024-06-17: 10 mg via INTRAVENOUS

## 2024-06-17 MED ORDER — OXYCODONE-ACETAMINOPHEN 5-325 MG PO TABS: 1.0000 | ORAL_TABLET | Freq: Three times a day (TID) | ORAL | 0 refills | Status: AC | PRN

## 2024-06-17 MED ORDER — OXYCODONE HCL 5 MG/5ML PO SOLN: 5.0000 mg | Freq: Once | ORAL | Status: DC | PRN

## 2024-06-17 MED ORDER — DOXYCYCLINE HYCLATE 100 MG IV SOLR
200.0000 mg | INTRAVENOUS | Status: AC
  Administered 2024-06-17: 200 mg via INTRAVENOUS
  Filled 2024-06-17: qty 200

## 2024-06-17 MED ORDER — ACETAMINOPHEN 325 MG PO TABS: 650.0000 mg | ORAL_TABLET | ORAL | Status: DC | PRN

## 2024-06-17 MED ORDER — SILVER NITRATE-POT NITRATE 75-25 % EX MISC
CUTANEOUS | Status: AC
  Filled 2024-06-17: qty 10

## 2024-06-17 MED ORDER — OXYCODONE HCL 5 MG PO TABS: 5.0000 mg | ORAL_TABLET | Freq: Once | ORAL | Status: DC | PRN

## 2024-06-17 MED ORDER — PHENYLEPHRINE 80 MCG/ML (10ML) SYRINGE FOR IV PUSH (FOR BLOOD PRESSURE SUPPORT)
PREFILLED_SYRINGE | INTRAVENOUS | Status: DC | PRN
  Administered 2024-06-17 (×3): 80 ug via INTRAVENOUS

## 2024-06-17 MED ORDER — ONDANSETRON HCL 4 MG/2ML IJ SOLN: 4.0000 mg | Freq: Once | INTRAMUSCULAR | Status: DC | PRN

## 2024-06-17 MED ORDER — IBUPROFEN 800 MG PO TABS: 800.0000 mg | ORAL_TABLET | Freq: Three times a day (TID) | ORAL | 1 refills | Status: AC | PRN

## 2024-06-17 MED ORDER — DEXMEDETOMIDINE HCL IN NACL 80 MCG/20ML IV SOLN
INTRAVENOUS | Status: DC | PRN
  Administered 2024-06-17: 8 ug via INTRAVENOUS
  Administered 2024-06-17: 12 ug via INTRAVENOUS

## 2024-06-17 MED ORDER — LACTATED RINGERS IV SOLN: INTRAVENOUS | Status: DC

## 2024-06-17 MED ORDER — FENTANYL CITRATE (PF) 100 MCG/2ML IJ SOLN
INTRAMUSCULAR | Status: AC
  Filled 2024-06-17: qty 2

## 2024-06-17 MED ORDER — FENTANYL CITRATE (PF) 100 MCG/2ML IJ SOLN
INTRAMUSCULAR | Status: DC | PRN
  Administered 2024-06-17 (×2): 50 ug via INTRAVENOUS

## 2024-06-17 MED ORDER — PROPOFOL 500 MG/50ML IV EMUL
INTRAVENOUS | Status: DC | PRN
  Administered 2024-06-17: 150 ug/kg/min via INTRAVENOUS

## 2024-06-17 MED ORDER — LIDOCAINE HCL (CARDIAC) PF 100 MG/5ML IV SOSY
PREFILLED_SYRINGE | INTRAVENOUS | Status: DC | PRN
  Administered 2024-06-17: 50 mg via INTRAVENOUS

## 2024-06-17 MED ORDER — MIDAZOLAM HCL 2 MG/2ML IJ SOLN
INTRAMUSCULAR | Status: AC
  Filled 2024-06-17: qty 2

## 2024-06-17 MED ORDER — 0.9 % SODIUM CHLORIDE (POUR BTL) OPTIME
TOPICAL | Status: DC | PRN
  Administered 2024-06-17: 500 mL

## 2024-06-17 MED ORDER — KETOROLAC TROMETHAMINE 30 MG/ML IJ SOLN
INTRAMUSCULAR | Status: DC | PRN
  Administered 2024-06-17: 30 mg via INTRAVENOUS

## 2024-06-17 MED ORDER — ONDANSETRON HCL 4 MG/2ML IJ SOLN: 4.0000 mg | Freq: Four times a day (QID) | INTRAMUSCULAR | Status: DC | PRN

## 2024-06-17 MED ORDER — PROPOFOL 10 MG/ML IV BOLUS
INTRAVENOUS | Status: AC
  Filled 2024-06-17: qty 20

## 2024-06-17 MED ORDER — FENTANYL CITRATE (PF) 100 MCG/2ML IJ SOLN: 25.0000 ug | INTRAMUSCULAR | Status: DC | PRN

## 2024-06-17 MED ORDER — MIDAZOLAM HCL 5 MG/5ML IJ SOLN
INTRAMUSCULAR | Status: DC | PRN
  Administered 2024-06-17: 2 mg via INTRAVENOUS

## 2024-06-17 MED ORDER — ACETAMINOPHEN 10 MG/ML IV SOLN
INTRAVENOUS | Status: AC
  Filled 2024-06-17: qty 100

## 2024-06-17 MED ORDER — MEDROXYPROGESTERONE ACETATE 150 MG/ML IM SUSP
150.0000 mg | Freq: Once | INTRAMUSCULAR | Status: AC
  Administered 2024-06-17: 150 mg via INTRAMUSCULAR
  Filled 2024-06-17: qty 1

## 2024-06-17 SURGICAL SUPPLY — 21 items
DRSG TELFA 3X8 NADH STRL (GAUZE/BANDAGES/DRESSINGS) ×1 IMPLANT
FILTER UTR ASPR SPEC (MISCELLANEOUS) ×1 IMPLANT
GLOVE BIO SURGEON STRL SZ 6 (GLOVE) ×1 IMPLANT
GLOVE BIOGEL PI IND STRL 6 (GLOVE) ×1 IMPLANT
GOWN STRL REUS W/ TWL LRG LVL3 (GOWN DISPOSABLE) ×1 IMPLANT
KIT BERKELEY 1ST TRIMESTER 3/8 (MISCELLANEOUS) ×1 IMPLANT
KIT TURNOVER KIT A (KITS) ×1 IMPLANT
NDL SPNL 20GX3.5 QUINCKE YW (NEEDLE) ×1 IMPLANT
NEEDLE SPNL 20GX3.5 QUINCKE YW (NEEDLE) IMPLANT
NS IRRIG 500ML POUR BTL (IV SOLUTION) IMPLANT
PACK DNC HYST (MISCELLANEOUS) ×1 IMPLANT
PAD OB MATERNITY 11 LF (PERSONAL CARE ITEMS) ×1 IMPLANT
PAD PREP OB/GYN DISP 24X41 (PERSONAL CARE ITEMS) ×1 IMPLANT
SCRUB CHG 4% DYNA-HEX 4OZ (MISCELLANEOUS) ×1 IMPLANT
SET BERKELEY SUCTION TUBING (SUCTIONS) ×1 IMPLANT
SOLUTION PREP PVP 2OZ (MISCELLANEOUS) ×1 IMPLANT
SYR 10ML LL (SYRINGE) ×1 IMPLANT
TRAP FLUID SMOKE EVACUATOR (MISCELLANEOUS) ×1 IMPLANT
VACURETTE 10 RIGID CVD (CANNULA) IMPLANT
VACURETTE 12 RIGID CVD (CANNULA) IMPLANT
WATER STERILE IRR 500ML POUR (IV SOLUTION) ×1 IMPLANT

## 2024-06-17 NOTE — Transfer of Care (Signed)
 Immediate Anesthesia Transfer of Care Note  Patient: Dawn Richards  Procedure(s) Performed: DILATION AND EVACUATION, UTERUS (Uterus)  Patient Location: PACU  Anesthesia Type:General  Level of Consciousness: awake  Airway & Oxygen Therapy: Patient Spontanous Breathing and Patient connected to nasal cannula oxygen  Post-op Assessment: Report given to RN and Post -op Vital signs reviewed and stable  Post vital signs: Reviewed and stable  Last Vitals:  Vitals Value Taken Time  BP 102/60 06/17/24 12:53  Temp    Pulse 64 06/17/24 12:55  Resp 17 06/17/24 12:55  SpO2 97 % 06/17/24 12:55  Vitals shown include unfiled device data.  Last Pain:  Vitals:   06/17/24 1115  TempSrc: Temporal  PainSc: 4          Complications: No notable events documented.

## 2024-06-17 NOTE — Progress Notes (Signed)
 GYNECOLOGY PREOPERATIVE HISTORY AND PHYSICAL   Subjective:  Dawn Richards is a 28 y.o. [id number] here for surgical management of retained products of conception.   Indications for procedure include: failed medical therapy. No significant preoperative concerns. We talked about her course overnight, she came to ED having already passed the fetus, but felt something was stuck. CNM Thompson removed fragments of placenta during SSE, but US  showed 4.2cm of retained POC. Pt was observed overnight and given Cytotec  400mcg x 1 around 2200 last night. She has not passed anything additional and bleeding has been moderate without clots. Hgb went down from 8.9 on 11/6 to 8.1 on 11/7. Of note, pt had a prior medical termination in May 2025 around 6wks. She started combined oral contraceptive pills after that, still became pregnant with this most recent. Desires something more effective.  Proposed surgery: Suction D&C  Pertinent Gynecological History: Menses: irregular (prior Depo use) Bleeding: heavy with clots after home abortion 06/16/24 Contraception: planning Depo Provera Last mammogram: N/A Last pap: normal Date: 05/14/2022   Past Medical History:  Diagnosis Date   DUB (dysfunctional uterine bleeding)    Pelvic pain    Past Surgical History:  Procedure Laterality Date   WISDOM TOOTH EXTRACTION     [age]; age [age]   OB History  Gravida Para Term Preterm AB Living  5 2 2  0 3 2  SAB IAB Ectopic Multiple Live Births  0 2 0 0 2    # Outcome Date GA Lbr Len/2nd Weight Sex Type Anes PTL Lv  5 IAB 06/14/24     TAB     4 IAB 12/10/23     TAB     3 Term 01/20/23 [redacted]w[redacted]d 14:43 / 01:30 4010 g F Vag-Spont EPI, Local  LIV  2 Term 01/31/20 [redacted]w[redacted]d / 00:30 3270 g F Vag-Spont None  LIV  1 AB           Patient denies any other pertinent gynecologic issues.  Family History  Problem Relation Age of Onset   Diabetes Mother    Kidney failure Father    Cancer Paternal Grandmother         breast   Breast cancer Neg Hx    Ovarian cancer Neg Hx    Colon cancer Neg Hx    Heart disease Neg Hx    Social History   Socioeconomic History   Marital status: Single    Spouse name: [name]   Number of children: 1   Years of education: 12   Highest education level: Not on file  Occupational History   Occupation: fast food- on feet all day  Tobacco Use   Smoking status: Former    Current packs/day: 0.00    Types: Cigarettes    Quit date: 05/23/2022    Years since quitting: 2.0   Smokeless tobacco: Never  Vaping Use   Vaping status: Former   Substances: Nicotine, Flavoring  Substance and Sexual Activity   Alcohol use: Not Currently    Comment: ocassionally   Drug use: Not Currently    Types: Marijuana    Comment: daily   Sexual activity: Yes    Partners: Male    Birth control/protection: I.U.D.  Other Topics Concern   Not on file  Social History Narrative   Not on file   Social Drivers of Health   Financial Resource Strain: Medium Risk (06/18/2022)   Overall Financial Resource Strain (CARDIA)    Difficulty of Paying Living Expenses: Somewhat hard  Food Insecurity: Food Insecurity Present (06/18/2022)   Hunger Vital Sign    Worried About Running Out of Food in the Last Year: Sometimes true    Ran Out of Food in the Last Year: Sometimes true  Transportation Needs: No Transportation Needs (01/20/2023)   PRAPARE - Administrator, Civil Service (Medical): No    Lack of Transportation (Non-Medical): No  Physical Activity: Insufficiently Active (06/18/2022)   Exercise Vital Sign    Days of Exercise per Week: 2 days    Minutes of Exercise per Session: 20 min  Stress: Stress Concern Present (06/18/2022)   [employer] of Occupational Health - Occupational Stress Questionnaire    Feeling of Stress : To some extent  Social Connections: Moderately Isolated (06/18/2022)   Social Connection and Isolation Panel    Frequency of Communication with Friends and  Family: More than three times a week    Frequency of Social Gatherings with Friends and Family: Twice a week    Attends Religious Services: Never    Database Administrator or Organizations: No    Attends Banker Meetings: Never    Marital Status: Living with partner  Intimate Partner Violence: Not At Risk (01/20/2023)   Humiliation, Afraid, Rape, and Kick questionnaire    Fear of Current or Ex-Partner: No    Emotionally Abused: No    Physically Abused: No    Sexually Abused: No   No current facility-administered medications on file prior to encounter.   Current Outpatient Medications on File Prior to Encounter  Medication Sig Dispense Refill   acetaminophen  (TYLENOL ) 325 MG tablet Take 2 tablets (650 mg total) by mouth every 4 (four) hours as needed (for pain scale < 4).     benzocaine -Menthol  (DERMOPLAST) 20-0.5 % AERO Apply 1 Application topically as needed for irritation (perineal discomfort). (Patient not taking: Reported on 06/17/2024)     coconut oil OIL Apply 1 Application topically as needed. (Patient not taking: Reported on 06/17/2024)  0   ondansetron  (ZOFRAN -ODT) 4 MG disintegrating tablet Take 1 tablet (4 mg total) by mouth every 8 (eight) hours as needed for nausea or vomiting. (Patient not taking: Reported on 06/17/2024) 20 tablet 0   Prenatal Vit-Fe Fumarate-FA (PRENATAL VITAMINS) 28-0.8 MG TABS Take 1 tablet by mouth daily. (Patient not taking: Reported on 06/17/2024) 30 tablet 11   witch hazel-glycerin  (TUCKS) pad Apply topically as needed for itching. 40 each 12   No Known Allergies  Review of Systems Constitutional: No recent fever/chills/sweats Respiratory: No recent cough/bronchitis Cardiovascular: No chest pain Gastrointestinal: No recent nausea/vomiting/diarrhea Genitourinary: No UTI symptoms Hematologic/lymphatic:No history of coagulopathy or recent blood thinner use    Objective:   Blood pressure 127/65, pulse 78, temperature 97.7 F (36.5 C),  temperature source Oral, resp. rate 20, height 5' (1.524 m), weight 97.5 kg, last menstrual period 03/14/2024, SpO2 100%, unknown if currently breastfeeding. CONSTITUTIONAL: Well-developed, well-nourished female in no acute distress.  HENT:  Normocephalic, atraumatic, External right and left ear normal. Oropharynx is clear and moist EYES: Conjunctivae and EOM are normal. Pupils are equal, round, and reactive to light. No scleral icterus.  NECK: Normal range of motion, supple, no masses SKIN: Skin is warm and dry. No rash noted. Not diaphoretic. No erythema. No pallor. NEUROLOGIC: Alert and oriented to person, place, and time. Normal reflexes, muscle tone coordination. No cranial nerve deficit noted. PSYCHIATRIC: Normal mood and affect. Normal behavior. Normal judgment and thought content. CARDIOVASCULAR: Normal heart rate noted, regular rhythm  RESPIRATORY: Effort and breath sounds normal, no problems with respiration noted ABDOMEN: Soft, nontender, nondistended. PELVIC: Deferred MUSCULOSKELETAL: Normal range of motion. No edema and no tenderness. 2+ distal pulses.    Labs: Results for orders placed or performed during the hospital encounter of 06/16/24 (from the past 2 weeks)  CBC with Differential/Platelet   Collection Time: 06/16/24  5:35 PM  Result Value Ref Range   WBC 8.9 4.0 - 10.5 K/uL   RBC 3.14 (L) 3.87 - 5.11 MIL/uL   Hemoglobin 8.9 (L) 12.0 - 15.0 g/dL   HCT 73.1 (L) 63.9 - 53.9 %   MCV 85.4 80.0 - 100.0 fL   MCH 28.3 26.0 - 34.0 pg   MCHC 33.2 30.0 - 36.0 g/dL   RDW 87.2 88.4 - 84.4 %   Platelets 247 150 - 400 K/uL   nRBC 0.0 0.0 - 0.2 %   Neutrophils Relative % 58 %   Neutro Abs 5.1 1.7 - 7.7 K/uL   Lymphocytes Relative 35 %   Lymphs Abs 3.1 0.7 - 4.0 K/uL   Monocytes Relative 5 %   Monocytes Absolute 0.4 0.1 - 1.0 K/uL   Eosinophils Relative 2 %   Eosinophils Absolute 0.1 0.0 - 0.5 K/uL   Basophils Relative 0 %   Basophils Absolute 0.0 0.0 - 0.1 K/uL   Immature  Granulocytes 0 %   Abs Immature Granulocytes 0.03 0.00 - 0.07 K/uL  hCG, quantitative, pregnancy   Collection Time: 06/16/24  5:35 PM  Result Value Ref Range   hCG, Beta Chain, Quant, S 14,347 (H) <5 mIU/mL  Urinalysis, Routine w reflex microscopic -Urine, Clean Catch   Collection Time: 06/17/24 12:09 AM  Result Value Ref Range   Color, Urine RED (A) YELLOW   APPearance CLOUDY (A) CLEAR   Specific Gravity, Urine  1.005 - 1.030    TEST NOT REPORTED DUE TO COLOR INTERFERENCE OF URINE PIGMENT   pH  5.0 - 8.0    TEST NOT REPORTED DUE TO COLOR INTERFERENCE OF URINE PIGMENT   Glucose, UA (A) NEGATIVE mg/dL    TEST NOT REPORTED DUE TO COLOR INTERFERENCE OF URINE PIGMENT   Hgb urine dipstick (A) NEGATIVE    TEST NOT REPORTED DUE TO COLOR INTERFERENCE OF URINE PIGMENT   Bilirubin Urine (A) NEGATIVE    TEST NOT REPORTED DUE TO COLOR INTERFERENCE OF URINE PIGMENT   Ketones, ur (A) NEGATIVE mg/dL    TEST NOT REPORTED DUE TO COLOR INTERFERENCE OF URINE PIGMENT   Protein, ur (A) NEGATIVE mg/dL    TEST NOT REPORTED DUE TO COLOR INTERFERENCE OF URINE PIGMENT   Nitrite (A) NEGATIVE    TEST NOT REPORTED DUE TO COLOR INTERFERENCE OF URINE PIGMENT   Leukocytes,Ua (A) NEGATIVE    TEST NOT REPORTED DUE TO COLOR INTERFERENCE OF URINE PIGMENT   RBC / HPF >50 0 - 5 RBC/hpf   WBC, UA >50 0 - 5 WBC/hpf   Bacteria, UA NONE SEEN NONE SEEN   Squamous Epithelial / HPF 0 0 - 5 /HPF   Mucus PRESENT    Ca Oxalate Crys, UA PRESENT   CBC   Collection Time: 06/17/24 12:09 AM  Result Value Ref Range   WBC 8.8 4.0 - 10.5 K/uL   RBC 2.85 (L) 3.87 - 5.11 MIL/uL   Hemoglobin 8.1 (L) 12.0 - 15.0 g/dL   HCT 75.7 (L) 63.9 - 53.9 %   MCV 84.9 80.0 - 100.0 fL   MCH 28.4 26.0 - 34.0 pg  MCHC 33.5 30.0 - 36.0 g/dL   RDW 87.2 88.4 - 84.4 %   Platelets 203 150 - 400 K/uL   nRBC 0.0 0.0 - 0.2 %  Type and screen [facility]   Collection Time: 06/17/24  4:24 AM  Result Value Ref Range   ABO/RH(D)  A POS    Antibody Screen NEG    Sample Expiration      06/20/2024,2359 Performed at [facility], [address] [place], [place] [address]      Imaging Studies: US  OB LESS THAN 14 WEEKS WITH OB TRANSVAGINAL Result Date: 06/16/2024 CLINICAL DATA:  Miscarriage, beta HCG 14,347, concern for retained products of conception EXAM: OBSTETRIC <14 WK US  AND TRANSVAGINAL OB US  TECHNIQUE: Both transabdominal and transvaginal ultrasound examinations were performed for complete evaluation of the gestation as well as the maternal uterus, adnexal regions, and pelvic cul-de-sac. Transvaginal technique was performed to assess early pregnancy. COMPARISON:  05/24/2024 FINDINGS: Intrauterine gestational sac: None Yolk sac:  Not Visualized. Embryo:  Not Visualized. Cardiac Activity: Not Visualized. Maternal uterus/adnexae: The uterus is anteverted. There is heterogeneous thickening of the endometrium measuring up to 4.2 cm in thickness, with increased vascularity on Doppler imaging consistent with retained products of conception. No free fluid within the pelvis. Right ovary measures 2.9 x 1.7 x 2.0 cm and the left ovary measures 3.0 x 2.0 x 2.3 cm. No adnexal masses. IMPRESSION: 1. Heterogeneous hypervascular endometrial thickening measuring up to 4.2 cm, consistent with retained products of conception. Electronically Signed   By: Ozell Daring M.D.   On: 06/16/2024 23:29   US  OB Comp Less 14 Wks Result Date: 05/24/2024 EXAM: OBSTETRIC ULTRASOUND FIRST TRIMESTER TECHNIQUE: Transvaginal first trimester obstetric pelvic duplex ultrasound was performed with real-time imaging, color flow Doppler imaging, and spectral analysis. COMPARISON: None provided. CLINICAL HISTORY: Pelvic pain. FINDINGS: UTERUS: No focal myometrial mass. GESTATIONAL SAC(S): Single normal appearing intrauterine gestational sac. No subchorionic hemorrhage. YOLK SAC: Present EMBRYO(<11WK) /FETUS(>=11WK): Single CROWN RUMP LENGTH: 36 mm  corresponding to gestational age of [redacted] weeks and 3 days. RATE OF CARDIAC ACTIVITY: 157 beats per minute. RIGHT OVARY: Unremarkable. Normal arterial and venous flow. LEFT OVARY: Unremarkable. Normal arterial and venous flow. FREE FLUID: No free fluid. MEASUREMENTS ESTIMATED GESTATIONAL AGE BY CURRENT ULTRASOUND: 10 weeks and 3 days. ESTIMATED GESTATIONAL AGE BY LMP/PRIOR ULTRASOUND: Not provided. ESTIMATED DUE DATE: 12/17/2024. IMPRESSION: 1. Single viable intrauterine pregnancy. 2. Estimated gestational age [redacted] weeks 3 days by crownrump length (36 mm). Estimated due date 12/17/2024. Electronically signed by: Greig Pique MD 05/24/2024 10:32 PM EDT RP Workstation: [id number]    Assessment:     28 y.o. [id number] s/p home medical abortion on 06/16/24 at [redacted]w[redacted]d, now with retained POCs, s/p two doses of Cytotec  overnight with observation, has not passed additional tissue, and desires D&C.      Plan:    Counseling: Procedure, risks, reasons, benefits and complications (including injury to bowel, bladder, major blood vessel, ureter, bleeding, possibility of transfusion, infection) reviewed in detail. Likelihood of success in alleviating the patient's condition was discussed. Routine postoperative instructions will be reviewed with the patient and her family in detail after surgery.   -Pre-op Doxycycline  for antibiotics ordered -Pt would like to start DepoProvera before discharge for contraception, ordered -Has been on clears only since last night. -Plan for DC home from PACU when stable post-procedurally. -To OR when able     Estil Mangle, DO [facility] [address]

## 2024-06-17 NOTE — Anesthesia Procedure Notes (Signed)
 Procedure Name: MAC Date/Time: 06/17/2024 12:00 PM  Performed by: Germaine Maeola CROME, CRNAPre-anesthesia Checklist: Patient identified, Emergency Drugs available, Suction available, Patient being monitored and Timeout performed Patient Re-evaluated:Patient Re-evaluated prior to induction Oxygen Delivery Method: Nasal cannula and Simple face mask Induction Type: IV induction Placement Confirmation: positive ETCO2 and CO2 detector

## 2024-06-17 NOTE — Progress Notes (Signed)
 Pt discharge paperwork given and reviewed with pt. Questions invited and answered. No distress noted. IV removed.pt noted understanding to teaching.

## 2024-06-17 NOTE — Op Note (Signed)
 DILATION AND CURETTAGE OPERATIVE REPORT  DATE: 06/17/24  PRE-OP DIAGNOSIS: Retained products of conception at [redacted]w[redacted]d after home medical abortion  POST-OP DIAGNOSIS: Same  PROCEDURE: Suction and sharp D&C  SURGEON: Dr. Estil Mangle  ANESTHESIA: General  IVF:  EBL:  UOP:  COMPLICATIONS: None  SPECIMEN: Products of conception  FINDINGS: 14-week sized anteverted uterus with cervix 1cm dilated; significant amounts of products of conception  DRAINS: None  CONDITION: Stable to recovery room  PROCEDURE: The risks, benefits, alternatives and indications of the procedure were discussed with the patient. She voiced an understanding of the procedure and informed consent was obtained.  The patient was taken to the OR where general anesthesia was administered without difficulty. She was given Doxycycline  200mg  IV as antibiotic prophylaxis. She was placed in the dorsal lithotomy position using the Allen stirrups. And exam under anesthesia revealed a 14-week sized anteverted uterus with the cervix 1cm dilated. Blood clots were noted in the vagina. The patient was then prepped and draped in the normal sterile fashion.   A weighted speculum was inserted in the posterior aspect of the vagina. A single-tooth tenaculum was used to grasp the anterior lip of the cervix. The uterus was carefully sounded to 14cm. An 12mm suction curette was advanced to the uterine fundus. The suction was them started. The products of conception were evacuated with the curette in a rotation motion on the outward movement requiring several passes. A sharp curettage was then performed in a clockwise fashion with a large curette until a gritty texture felt in all four quadrants. The suction curette was then reintroduced to clear the uterus. The tenaculum was removed from the cervix and good hemostasis was noted.   The patient tolerated the procedure well. She was taken to the recovery room in stable condition.     She can be discharged home from PACU after meeting necessary recovery milestones from anesthesia. Pain medications have been sent to her pharmacy and post-op instructions provided.  She will follow up with [facility] in 4 weeks for a post-op visit.  Depo Provera was administered prior to procedure for contraception.    Estil Mangle, DO [facility]

## 2024-06-17 NOTE — Anesthesia Preprocedure Evaluation (Signed)
 Anesthesia Evaluation  Patient identified by MRN, date of birth, ID band Patient awake    Reviewed: Allergy & Precautions, NPO status , Patient's Chart, lab work & pertinent test results  Airway Mallampati: II  TM Distance: >3 FB Neck ROM: Full    Dental  (+) Teeth Intact   Pulmonary neg pulmonary ROS, Patient abstained from smoking., former smoker   Pulmonary exam normal breath sounds clear to auscultation       Cardiovascular Exercise Tolerance: Good negative cardio ROS Normal cardiovascular exam Rhythm:Regular Rate:Normal     Neuro/Psych negative neurological ROS  negative psych ROS   GI/Hepatic negative GI ROS, Neg liver ROS,,,  Endo/Other  negative endocrine ROS    Renal/GU negative Renal ROS  negative genitourinary   Musculoskeletal   Abdominal   Peds negative pediatric ROS (+)  Hematology negative hematology ROS (+)   Anesthesia Other Findings Past Medical History: No date: DUB (dysfunctional uterine bleeding) No date: Pelvic pain  Past Surgical History: No date: WISDOM TOOTH EXTRACTION     Comment:  [age]; age [age]  BMI    Body Mass Index: 41.99 kg/m      Reproductive/Obstetrics negative OB ROS                              Anesthesia Physical Anesthesia Plan  ASA: 2  Anesthesia Plan: General   Post-op Pain Management:    Induction: Intravenous  PONV Risk Score and Plan: Propofol infusion and TIVA  Airway Management Planned: Natural Airway and Nasal Cannula  Additional Equipment:   Intra-op Plan:   Post-operative Plan:   Informed Consent: I have reviewed the patients History and Physical, chart, labs and discussed the procedure including the risks, benefits and alternatives for the proposed anesthesia with the patient or authorized representative who has indicated his/her understanding and acceptance.     Dental Advisory Given  Plan Discussed with:  CRNA  Anesthesia Plan Comments:         Anesthesia Quick Evaluation

## 2024-06-18 NOTE — Discharge Summary (Signed)
 Patient ID: Dawn Richards MRN: [id number] DOB/AGE: January 16, 1996 28 y.o.  Admit date: 06/16/2024 Discharge date: 06/18/2024  Admission Diagnoses: retained products of conception after home medical termination of pregnancy  Discharge Diagnoses: same, s/p dilation and curettage  Prenatal Care Site: None  Prenatal Procedures: Dating US   Consults: None  Significant Diagnostic Studies:  Results for orders placed or performed during the hospital encounter of 06/16/24 (from the past week)  CBC with Differential/Platelet   Collection Time: 06/16/24  5:35 PM  Result Value Ref Range   WBC 8.9 4.0 - 10.5 K/uL   RBC 3.14 (L) 3.87 - 5.11 MIL/uL   Hemoglobin 8.9 (L) 12.0 - 15.0 g/dL   HCT 73.1 (L) 63.9 - 53.9 %   MCV 85.4 80.0 - 100.0 fL   MCH 28.3 26.0 - 34.0 pg   MCHC 33.2 30.0 - 36.0 g/dL   RDW 87.2 88.4 - 84.4 %   Platelets 247 150 - 400 K/uL   nRBC 0.0 0.0 - 0.2 %   Neutrophils Relative % 58 %   Neutro Abs 5.1 1.7 - 7.7 K/uL   Lymphocytes Relative 35 %   Lymphs Abs 3.1 0.7 - 4.0 K/uL   Monocytes Relative 5 %   Monocytes Absolute 0.4 0.1 - 1.0 K/uL   Eosinophils Relative 2 %   Eosinophils Absolute 0.1 0.0 - 0.5 K/uL   Basophils Relative 0 %   Basophils Absolute 0.0 0.0 - 0.1 K/uL   Immature Granulocytes 0 %   Abs Immature Granulocytes 0.03 0.00 - 0.07 K/uL  hCG, quantitative, pregnancy   Collection Time: 06/16/24  5:35 PM  Result Value Ref Range   hCG, Beta Chain, Quant, S 14,347 (H) <5 mIU/mL  Urinalysis, Routine w reflex microscopic -Urine, Clean Catch   Collection Time: 06/17/24 12:09 AM  Result Value Ref Range   Color, Urine RED (A) YELLOW   APPearance CLOUDY (A) CLEAR   Specific Gravity, Urine  1.005 - 1.030    TEST NOT REPORTED DUE TO COLOR INTERFERENCE OF URINE PIGMENT   pH  5.0 - 8.0    TEST NOT REPORTED DUE TO COLOR INTERFERENCE OF URINE PIGMENT   Glucose, UA (A) NEGATIVE mg/dL    TEST NOT REPORTED DUE TO COLOR INTERFERENCE OF URINE PIGMENT   Hgb  urine dipstick (A) NEGATIVE    TEST NOT REPORTED DUE TO COLOR INTERFERENCE OF URINE PIGMENT   Bilirubin Urine (A) NEGATIVE    TEST NOT REPORTED DUE TO COLOR INTERFERENCE OF URINE PIGMENT   Ketones, ur (A) NEGATIVE mg/dL    TEST NOT REPORTED DUE TO COLOR INTERFERENCE OF URINE PIGMENT   Protein, ur (A) NEGATIVE mg/dL    TEST NOT REPORTED DUE TO COLOR INTERFERENCE OF URINE PIGMENT   Nitrite (A) NEGATIVE    TEST NOT REPORTED DUE TO COLOR INTERFERENCE OF URINE PIGMENT   Leukocytes,Ua (A) NEGATIVE    TEST NOT REPORTED DUE TO COLOR INTERFERENCE OF URINE PIGMENT   RBC / HPF >50 0 - 5 RBC/hpf   WBC, UA >50 0 - 5 WBC/hpf   Bacteria, UA NONE SEEN NONE SEEN   Squamous Epithelial / HPF 0 0 - 5 /HPF   Mucus PRESENT    Ca Oxalate Crys, UA PRESENT   CBC   Collection Time: 06/17/24 12:09 AM  Result Value Ref Range   WBC 8.8 4.0 - 10.5 K/uL   RBC 2.85 (L) 3.87 - 5.11 MIL/uL   Hemoglobin 8.1 (L) 12.0 - 15.0 g/dL   HCT  24.2 (L) 36.0 - 46.0 %   MCV 84.9 80.0 - 100.0 fL   MCH 28.4 26.0 - 34.0 pg   MCHC 33.5 30.0 - 36.0 g/dL   RDW 87.2 88.4 - 84.4 %   Platelets 203 150 - 400 K/uL   nRBC 0.0 0.0 - 0.2 %  Type and screen [facility]   Collection Time: 06/17/24  4:24 AM  Result Value Ref Range   ABO/RH(D) A POS    Antibody Screen NEG    Sample Expiration      06/20/2024,2359 Performed at [facility], [address] [place], [place] [address]     Treatments: IV hydration, antibiotics: Doxycycline , analgesia: Morphine , and surgery: Suction [facility]  Hospital Course:  This is a 28 y.o. [id number] with prior IUP at [redacted]w[redacted]d admitted for retained POCs after inducing a home medical termination and passing fetus earlier in the day at home, noted to have a cervical exam of 1cm with protruding clots and POCs, partially removed in ED by CNM.  She was initially observed overnight and given Cytotec  400mcg x 1, but did not pass any further POCs. On 06/17/24, D&C recommended and pt agreed,  performed and large amount of POC removed. She received one dose of Depo Provera and post-op she was deemed stable for discharge to home with outpatient follow up.  Discharge Physical Exam:  BP 98/60   Pulse 75   Temp 98.3 F (36.8 C) (Oral)   Resp 18   Ht 5' (1.524 m)   Wt 97.5 kg   LMP 03/14/2024 (Approximate)   SpO2 97%   BMI 41.99 kg/m   General: NAD CV: RRR Pulm: CTABL, nl effort ABD: s/nd/nt, gravid DVT Evaluation: LE non-ttp, no evidence of DVT on exam.  SVE:  N/A  Discharge Condition: Stable  Disposition: Discharge disposition: 01-Home or Self Care      Discharge Instructions     Call MD for:  redness, tenderness, or signs of infection (pain, swelling, redness, odor or green/yellow discharge around incision site)   Complete by: As directed    Call MD for:  severe uncontrolled pain   Complete by: As directed    Call MD for:  temperature >100.4   Complete by: As directed    Diet general   Complete by: As directed    Driving Restrictions   Complete by: As directed    Do not drive if taking narcotic   Increase activity slowly   Complete by: As directed    Sexual Activity Restrictions   Complete by: As directed    Pelvic rest for 2 weeks, do not submerge in water (baths, hot tubs, swimming).      Allergies as of 06/17/2024   No Known Allergies      Medication List     STOP taking these medications    benzocaine -Menthol  20-0.5 % Aero Commonly known as: DERMOPLAST   coconut oil Oil   ondansetron  4 MG disintegrating tablet Commonly known as: ZOFRAN -ODT       TAKE these medications    acetaminophen  325 MG tablet Commonly known as: Tylenol  Take 2 tablets (650 mg total) by mouth every 4 (four) hours as needed (for pain scale < 4).   ibuprofen  800 MG tablet Commonly known as: ADVIL  Take 1 tablet (800 mg total) by mouth every 8 (eight) hours as needed for cramping or moderate pain (pain score 4-6).   oxyCODONE -acetaminophen  5-325 MG  tablet Commonly known as: PERCOCET/ROXICET Take 1 tablet by  mouth every 8 (eight) hours as needed for severe pain (pain score 7-10).   Prenatal Vitamins 28-0.8 MG Tabs Take 1 tablet by mouth daily.   witch hazel-glycerin  pad Commonly known as: TUCKS Apply topically as needed for itching.        Follow-up Information     Leigh Sober, MD. Schedule an appointment as soon as possible for a visit in 4 week(s).   Specialties: Obstetrics, Gynecology Why: Post-op visit Contact information: [address] [place] [address] [phone]                 Signed:  Sober Leigh 06/18/2024 2:16 PM ----- [facility]

## 2024-06-20 ENCOUNTER — Encounter: Payer: Self-pay | Admitting: Obstetrics

## 2024-06-20 LAB — SURGICAL PATHOLOGY

## 2024-06-20 NOTE — Anesthesia Postprocedure Evaluation (Signed)
 Anesthesia Post Note  Patient: Dawn Richards  Procedure(s) Performed: DILATION AND EVACUATION, UTERUS (Uterus)  Patient location during evaluation: PACU Anesthesia Type: General Level of consciousness: awake Pain management: satisfactory to patient Respiratory status: spontaneous breathing Cardiovascular status: stable Anesthetic complications: no   No notable events documented.   Last Vitals:  Vitals:   06/17/24 1315 06/17/24 1554  BP: (!) 95/54 98/60  Pulse: 68 75  Resp: 17 18  Temp: (!) 36.2 C 36.8 C  SpO2: 98% 97%    Last Pain:  Vitals:   06/17/24 1554  TempSrc: Oral  PainSc:                  VAN STAVEREN,Vincy Feliz

## 2024-07-01 ENCOUNTER — Encounter: Payer: Self-pay | Admitting: Obstetrics
